# Patient Record
Sex: Male | Born: 1940 | Race: White | Hispanic: No | Marital: Married | Smoking: Former smoker
Health system: Southern US, Community
[De-identification: ages and names within clinical notes are randomized; demographics above are authoritative.]

## PROBLEM LIST (undated history)

## (undated) DIAGNOSIS — I2510 Atherosclerotic heart disease of native coronary artery without angina pectoris: Secondary | ICD-10-CM

## (undated) DIAGNOSIS — E785 Hyperlipidemia, unspecified: Secondary | ICD-10-CM

## (undated) DIAGNOSIS — R7881 Bacteremia: Secondary | ICD-10-CM

## (undated) DIAGNOSIS — I82409 Acute embolism and thrombosis of unspecified deep veins of unspecified lower extremity: Secondary | ICD-10-CM

## (undated) DIAGNOSIS — A0472 Enterocolitis due to Clostridium difficile, not specified as recurrent: Secondary | ICD-10-CM

## (undated) DIAGNOSIS — J690 Pneumonitis due to inhalation of food and vomit: Secondary | ICD-10-CM

## (undated) DIAGNOSIS — K7460 Unspecified cirrhosis of liver: Secondary | ICD-10-CM

## (undated) DIAGNOSIS — R6521 Severe sepsis with septic shock: Secondary | ICD-10-CM

## (undated) DIAGNOSIS — K7581 Nonalcoholic steatohepatitis (NASH): Secondary | ICD-10-CM

## (undated) DIAGNOSIS — K7469 Other cirrhosis of liver: Secondary | ICD-10-CM

## (undated) DIAGNOSIS — D649 Anemia, unspecified: Secondary | ICD-10-CM

## (undated) DIAGNOSIS — K7290 Hepatic failure, unspecified without coma: Secondary | ICD-10-CM

## (undated) DIAGNOSIS — I10 Essential (primary) hypertension: Secondary | ICD-10-CM

## (undated) DIAGNOSIS — D696 Thrombocytopenia, unspecified: Secondary | ICD-10-CM

## (undated) DIAGNOSIS — I8500 Esophageal varices without bleeding: Secondary | ICD-10-CM

## (undated) DIAGNOSIS — K922 Gastrointestinal hemorrhage, unspecified: Secondary | ICD-10-CM

## (undated) DIAGNOSIS — G4730 Sleep apnea, unspecified: Secondary | ICD-10-CM

## (undated) DIAGNOSIS — N289 Disorder of kidney and ureter, unspecified: Secondary | ICD-10-CM

## (undated) DIAGNOSIS — A419 Sepsis, unspecified organism: Secondary | ICD-10-CM

## (undated) HISTORY — DX: Acute embolism and thrombosis of unspecified deep veins of unspecified lower extremity: I82.409

## (undated) HISTORY — DX: Essential (primary) hypertension: I10

## (undated) HISTORY — PX: BACK SURGERY: SHX140

## (undated) HISTORY — DX: Thrombocytopenia, unspecified: D69.6

## (undated) HISTORY — DX: Bacteremia: R78.81

## (undated) HISTORY — PX: CARPAL TUNNEL RELEASE: SHX101

## (undated) HISTORY — DX: Sleep apnea, unspecified: G47.30

## (undated) HISTORY — DX: Pneumonitis due to inhalation of food and vomit: J69.0

## (undated) HISTORY — DX: Hepatic failure, unspecified without coma: K72.90

## (undated) HISTORY — DX: Nonalcoholic steatohepatitis (NASH): K75.81

## (undated) HISTORY — DX: Gastrointestinal hemorrhage, unspecified: K92.2

## (undated) HISTORY — DX: Atherosclerotic heart disease of native coronary artery without angina pectoris: I25.10

## (undated) HISTORY — DX: Hyperlipidemia, unspecified: E78.5

## (undated) HISTORY — DX: Enterocolitis due to Clostridium difficile, not specified as recurrent: A04.72

## (undated) HISTORY — DX: Other cirrhosis of liver: K74.69

## (undated) HISTORY — DX: Sepsis, unspecified organism: R65.21

## (undated) HISTORY — DX: Esophageal varices without bleeding: I85.00

## (undated) HISTORY — DX: Unspecified cirrhosis of liver: K74.60

## (undated) HISTORY — PX: AORTIC VALVE REPLACEMENT: SHX41

## (undated) HISTORY — PX: OTHER SURGICAL HISTORY: SHX169

## (undated) HISTORY — DX: Disorder of kidney and ureter, unspecified: N28.9

## (undated) HISTORY — DX: Anemia, unspecified: D64.9

## (undated) HISTORY — DX: Sepsis, unspecified organism: A41.9

---

## 2005-06-11 ENCOUNTER — Ambulatory Visit: Payer: Self-pay | Admitting: Internal Medicine

## 2005-07-16 ENCOUNTER — Ambulatory Visit: Payer: Self-pay | Admitting: Internal Medicine

## 2008-07-13 ENCOUNTER — Ambulatory Visit: Payer: Self-pay | Admitting: Gastroenterology

## 2008-07-16 ENCOUNTER — Ambulatory Visit: Payer: Self-pay | Admitting: Gastroenterology

## 2008-08-31 ENCOUNTER — Ambulatory Visit: Payer: Self-pay | Admitting: Gastroenterology

## 2009-06-21 ENCOUNTER — Ambulatory Visit: Payer: Self-pay | Admitting: Gastroenterology

## 2009-08-26 ENCOUNTER — Ambulatory Visit: Payer: Self-pay | Admitting: Rheumatology

## 2009-10-07 ENCOUNTER — Ambulatory Visit: Payer: Self-pay | Admitting: Unknown Physician Specialty

## 2009-10-13 ENCOUNTER — Ambulatory Visit: Payer: Self-pay | Admitting: Unknown Physician Specialty

## 2010-03-10 ENCOUNTER — Ambulatory Visit: Payer: Self-pay | Admitting: Ophthalmology

## 2010-03-20 ENCOUNTER — Ambulatory Visit: Payer: Self-pay | Admitting: Ophthalmology

## 2010-04-19 ENCOUNTER — Ambulatory Visit: Payer: Self-pay | Admitting: Ophthalmology

## 2010-05-08 ENCOUNTER — Ambulatory Visit: Payer: Self-pay | Admitting: Ophthalmology

## 2010-10-06 ENCOUNTER — Ambulatory Visit: Payer: Self-pay

## 2010-10-25 ENCOUNTER — Ambulatory Visit: Payer: Self-pay | Admitting: Pain Medicine

## 2010-11-02 ENCOUNTER — Ambulatory Visit: Payer: Self-pay | Admitting: Pain Medicine

## 2010-11-13 ENCOUNTER — Ambulatory Visit: Payer: Self-pay | Admitting: Pain Medicine

## 2010-12-05 ENCOUNTER — Ambulatory Visit: Payer: Self-pay | Admitting: Pain Medicine

## 2010-12-21 ENCOUNTER — Ambulatory Visit: Payer: Self-pay | Admitting: Pain Medicine

## 2011-01-02 ENCOUNTER — Ambulatory Visit: Payer: Self-pay

## 2011-01-04 ENCOUNTER — Ambulatory Visit: Payer: Self-pay

## 2011-02-08 ENCOUNTER — Ambulatory Visit: Payer: Self-pay | Admitting: Pain Medicine

## 2011-02-26 ENCOUNTER — Ambulatory Visit: Payer: Self-pay | Admitting: Internal Medicine

## 2012-03-14 ENCOUNTER — Ambulatory Visit: Payer: Self-pay | Admitting: Gastroenterology

## 2012-03-14 LAB — PLATELET COUNT: Platelet: 69 10*3/uL — ABNORMAL LOW (ref 150–440)

## 2012-03-14 LAB — PROTIME-INR: Prothrombin Time: 15.1 secs — ABNORMAL HIGH (ref 11.5–14.7)

## 2012-07-08 ENCOUNTER — Ambulatory Visit: Payer: Self-pay | Admitting: Gastroenterology

## 2012-07-08 LAB — CBC WITH DIFFERENTIAL/PLATELET
Basophil #: 0 10*3/uL (ref 0.0–0.1)
Basophil %: 0.5 %
Eosinophil #: 0.2 10*3/uL (ref 0.0–0.7)
HGB: 10.9 g/dL — ABNORMAL LOW (ref 13.0–18.0)
Lymphocyte #: 0.7 10*3/uL — ABNORMAL LOW (ref 1.0–3.6)
Lymphocyte %: 10.6 %
MCH: 41.9 pg — ABNORMAL HIGH (ref 26.0–34.0)
MCHC: 35.9 g/dL (ref 32.0–36.0)
MCV: 117 fL — ABNORMAL HIGH (ref 80–100)
Monocyte #: 1 x10 3/mm (ref 0.2–1.0)
Neutrophil #: 4.5 10*3/uL (ref 1.4–6.5)
Neutrophil %: 70.3 %
RBC: 2.59 10*6/uL — ABNORMAL LOW (ref 4.40–5.90)
RDW: 16.9 % — ABNORMAL HIGH (ref 11.5–14.5)

## 2012-07-08 LAB — PROTIME-INR: INR: 1.2

## 2012-08-26 ENCOUNTER — Ambulatory Visit: Payer: Self-pay | Admitting: Gastroenterology

## 2012-12-10 ENCOUNTER — Emergency Department: Payer: Self-pay | Admitting: Emergency Medicine

## 2013-01-12 ENCOUNTER — Ambulatory Visit: Payer: Self-pay | Admitting: Rheumatology

## 2013-01-29 ENCOUNTER — Inpatient Hospital Stay: Payer: Self-pay | Admitting: Internal Medicine

## 2013-01-29 LAB — CBC WITH DIFFERENTIAL/PLATELET
Basophil #: 0.1 10*3/uL (ref 0.0–0.1)
Basophil #: 0 10*3/uL (ref 0.0–0.1)
Basophil %: 1.1 %
Basophil %: 0.3 %
Basophil %: 1.1 %
Eosinophil #: 0.3 10*3/uL (ref 0.0–0.7)
Eosinophil %: 5.2 %
Eosinophil %: 0.3 %
Eosinophil %: 0.2 %
HCT: 21.6 % — ABNORMAL LOW (ref 40.0–52.0)
HCT: 23.9 % — ABNORMAL LOW (ref 40.0–52.0)
HGB: 7.5 g/dL — ABNORMAL LOW (ref 13.0–18.0)
HGB: 8.1 g/dL — ABNORMAL LOW (ref 13.0–18.0)
Lymphocyte #: 1 10*3/uL (ref 1.0–3.6)
Lymphocyte %: 20.7 %
Lymphocyte %: 5.7 %
Lymphocyte %: 10.4 %
MCH: 33.1 pg (ref 26.0–34.0)
MCHC: 34 g/dL (ref 32.0–36.0)
MCHC: 33.4 g/dL (ref 32.0–36.0)
MCV: 104 fL — ABNORMAL HIGH (ref 80–100)
MCV: 97 fL (ref 80–100)
MCV: 98 fL (ref 80–100)
Monocyte #: 0.5 x10 3/mm (ref 0.2–1.0)
Monocyte #: 0.7 x10 3/mm (ref 0.2–1.0)
Monocyte %: 7.3 %
Monocyte %: 5.6 %
Neutrophil #: 8 10*3/uL — ABNORMAL HIGH (ref 1.4–6.5)
Neutrophil #: 12.6 10*3/uL — ABNORMAL HIGH (ref 1.4–6.5)
Neutrophil %: 83.9 %
Platelet: 71 10*3/uL — ABNORMAL LOW (ref 150–440)
RBC: 2.09 10*6/uL — ABNORMAL LOW (ref 4.40–5.90)
RBC: 2.46 10*6/uL — ABNORMAL LOW (ref 4.40–5.90)
WBC: 5 10*3/uL (ref 3.8–10.6)
WBC: 15.1 10*3/uL — ABNORMAL HIGH (ref 3.8–10.6)

## 2013-01-29 LAB — COMPREHENSIVE METABOLIC PANEL
Alkaline Phosphatase: 115 U/L (ref 50–136)
Anion Gap: 5 — ABNORMAL LOW (ref 7–16)
BUN: 39 mg/dL — ABNORMAL HIGH (ref 7–18)
Bilirubin,Total: 0.6 mg/dL (ref 0.2–1.0)
Chloride: 110 mmol/L — ABNORMAL HIGH (ref 98–107)
Co2: 28 mmol/L (ref 21–32)
EGFR (African American): >60
EGFR (Non-African Amer.): >60
Osmolality: 295 (ref 275–301)
Potassium: 4.2 mmol/L (ref 3.5–5.1)
SGOT(AST): 48 U/L — ABNORMAL HIGH (ref 15–37)
Total Protein: 5.7 g/dL — ABNORMAL LOW (ref 6.4–8.2)

## 2013-01-29 LAB — PROTIME-INR: INR: 1.3

## 2013-01-30 LAB — CBC WITH DIFFERENTIAL/PLATELET
Basophil #: 0 10*3/uL (ref 0.0–0.1)
Basophil %: 0.2 %
Basophil %: 0.3 %
Eosinophil #: 0.1 10*3/uL (ref 0.0–0.7)
Eosinophil #: 0.1 10*3/uL (ref 0.0–0.7)
Eosinophil %: 0.8 %
Eosinophil %: 1.3 %
HCT: 21.3 % — ABNORMAL LOW (ref 40.0–52.0)
HGB: 7.6 g/dL — ABNORMAL LOW (ref 13.0–18.0)
HGB: 7.2 g/dL — ABNORMAL LOW (ref 13.0–18.0)
Lymphocyte #: 1.1 10*3/uL (ref 1.0–3.6)
Lymphocyte #: 0.7 10*3/uL — ABNORMAL LOW (ref 1.0–3.6)
Lymphocyte %: 13.6 %
Lymphocyte %: 9.9 %
MCH: 33.7 pg (ref 26.0–34.0)
MCH: 33.2 pg (ref 26.0–34.0)
MCHC: 33.7 g/dL (ref 32.0–36.0)
MCV: 98 fL (ref 80–100)
MCV: 98 fL (ref 80–100)
Monocyte #: 0.4 x10 3/mm (ref 0.2–1.0)
Monocyte #: 0.3 x10 3/mm (ref 0.2–1.0)
Monocyte %: 4.9 %
Neutrophil #: 6.8 10*3/uL — ABNORMAL HIGH (ref 1.4–6.5)
Neutrophil #: 5.9 10*3/uL (ref 1.4–6.5)
Neutrophil %: 80.2 %
Neutrophil %: 83.6 %
Platelet: 44 10*3/uL — ABNORMAL LOW (ref 150–440)
Platelet: 42 10*3/uL — ABNORMAL LOW (ref 150–440)
RBC: 2.26 10*6/uL — ABNORMAL LOW (ref 4.40–5.90)
RBC: 2.17 10*6/uL — ABNORMAL LOW (ref 4.40–5.90)
RDW: 18.7 % — ABNORMAL HIGH (ref 11.5–14.5)
RDW: 18.8 % — ABNORMAL HIGH (ref 11.5–14.5)
WBC: 7.1 10*3/uL (ref 3.8–10.6)

## 2013-01-30 LAB — URINALYSIS, COMPLETE
Bacteria: NONE SEEN
Bilirubin,UR: NEGATIVE
Blood: NEGATIVE
Glucose,UR: NEGATIVE mg/dL (ref 0–75)
Ketone: NEGATIVE
Leukocyte Esterase: NEGATIVE
Nitrite: NEGATIVE
Ph: 5 (ref 4.5–8.0)
Protein: NEGATIVE
RBC,UR: 1 /HPF (ref 0–5)
Specific Gravity: 1.019 (ref 1.003–1.030)
Squamous Epithelial: NONE SEEN
WBC UR: <1 /HPF (ref 0–5)

## 2013-01-31 LAB — BASIC METABOLIC PANEL
BUN: 28 mg/dL — ABNORMAL HIGH (ref 7–18)
Calcium, Total: 7.2 mg/dL — ABNORMAL LOW (ref 8.5–10.1)
Co2: 23 mmol/L (ref 21–32)
Creatinine: 0.97 mg/dL (ref 0.60–1.30)
EGFR (African American): >60
EGFR (Non-African Amer.): >60
Glucose: 101 mg/dL — ABNORMAL HIGH (ref 65–99)
Osmolality: 296 (ref 275–301)
Potassium: 3.2 mmol/L — ABNORMAL LOW (ref 3.5–5.1)
Sodium: 146 mmol/L — ABNORMAL HIGH (ref 136–145)

## 2013-01-31 LAB — CBC WITH DIFFERENTIAL/PLATELET
Basophil %: 0.2 %
Eosinophil #: 0.2 10*3/uL (ref 0.0–0.7)
Eosinophil %: 3.2 %
HCT: 18.9 % — ABNORMAL LOW (ref 40.0–52.0)
Lymphocyte %: 12.7 %
MCHC: 33.7 g/dL (ref 32.0–36.0)
MCV: 98 fL (ref 80–100)
Monocyte #: 0.3 x10 3/mm (ref 0.2–1.0)
Monocyte %: 6.4 %
Neutrophil #: 4.2 10*3/uL (ref 1.4–6.5)
Neutrophil %: 77.5 %
RBC: 1.92 10*6/uL — ABNORMAL LOW (ref 4.40–5.90)
RDW: 18.8 % — ABNORMAL HIGH (ref 11.5–14.5)

## 2013-02-01 LAB — BASIC METABOLIC PANEL
Calcium, Total: 7.6 mg/dL — ABNORMAL LOW (ref 8.5–10.1)
Co2: 22 mmol/L (ref 21–32)
EGFR (African American): >60
EGFR (Non-African Amer.): >60
Osmolality: 286 (ref 275–301)
Potassium: 3.9 mmol/L (ref 3.5–5.1)
Sodium: 142 mmol/L (ref 136–145)

## 2013-02-01 LAB — CBC WITH DIFFERENTIAL/PLATELET
Basophil #: 0 10*3/uL (ref 0.0–0.1)
Basophil %: 0.3 %
Eosinophil #: 0.3 10*3/uL (ref 0.0–0.7)
Eosinophil %: 4.6 %
HCT: 26.7 % — ABNORMAL LOW (ref 40.0–52.0)
HGB: 9.1 g/dL — ABNORMAL LOW (ref 13.0–18.0)
MCHC: 34.1 g/dL (ref 32.0–36.0)
MCV: 98 fL (ref 80–100)
Monocyte #: 0.5 x10 3/mm (ref 0.2–1.0)
Monocyte %: 7.5 %
Neutrophil #: 4.9 10*3/uL (ref 1.4–6.5)
Platelet: 49 10*3/uL — ABNORMAL LOW (ref 150–440)
RBC: 2.74 10*6/uL — ABNORMAL LOW (ref 4.40–5.90)
RDW: 19.1 % — ABNORMAL HIGH (ref 11.5–14.5)
WBC: 6.4 10*3/uL (ref 3.8–10.6)

## 2013-02-02 LAB — CBC WITH DIFFERENTIAL/PLATELET
Basophil #: 0 10*3/uL (ref 0.0–0.1)
Basophil %: 0.5 %
Eosinophil #: 0.4 10*3/uL (ref 0.0–0.7)
Eosinophil %: 6.4 %
HCT: 27.6 % — ABNORMAL LOW (ref 40.0–52.0)
MCH: 33.7 pg (ref 26.0–34.0)
Monocyte #: 0.8 x10 3/mm (ref 0.2–1.0)
Neutrophil %: 68.2 %
RDW: 18.4 % — ABNORMAL HIGH (ref 11.5–14.5)

## 2013-02-02 LAB — BASIC METABOLIC PANEL
Calcium, Total: 7.6 mg/dL — ABNORMAL LOW (ref 8.5–10.1)
Chloride: 108 mmol/L — ABNORMAL HIGH (ref 98–107)
Co2: 26 mmol/L (ref 21–32)
Creatinine: 0.76 mg/dL (ref 0.60–1.30)
Glucose: 82 mg/dL (ref 65–99)
Osmolality: 279 (ref 275–301)
Potassium: 3.6 mmol/L (ref 3.5–5.1)
Sodium: 139 mmol/L (ref 136–145)

## 2014-04-26 ENCOUNTER — Ambulatory Visit: Payer: Self-pay | Admitting: Oncology

## 2014-05-14 ENCOUNTER — Inpatient Hospital Stay: Payer: Self-pay | Admitting: Internal Medicine

## 2014-05-14 LAB — URINALYSIS, COMPLETE
BILIRUBIN, UR: NEGATIVE
Bacteria: NONE SEEN
GLUCOSE, UR: NEGATIVE mg/dL (ref 0–75)
Hyaline Cast: 21
KETONE: NEGATIVE
LEUKOCYTE ESTERASE: NEGATIVE
Nitrite: NEGATIVE
PH: 5 (ref 4.5–8.0)
PROTEIN: NEGATIVE
RBC,UR: 2 /HPF (ref 0–5)
SPECIFIC GRAVITY: 1.013 (ref 1.003–1.030)
Squamous Epithelial: 1
WBC UR: 2 /HPF (ref 0–5)

## 2014-05-14 LAB — CBC WITH DIFFERENTIAL/PLATELET
BASOS PCT: 0.5 %
Basophil #: 0 10*3/uL (ref 0.0–0.1)
Eosinophil #: 0 10*3/uL (ref 0.0–0.7)
Eosinophil %: 0 %
HCT: 31.7 % — ABNORMAL LOW (ref 40.0–52.0)
HGB: 10.9 g/dL — ABNORMAL LOW (ref 13.0–18.0)
LYMPHS PCT: 0.9 %
Lymphocyte #: 0.1 10*3/uL — ABNORMAL LOW (ref 1.0–3.6)
MCH: 38.3 pg — ABNORMAL HIGH (ref 26.0–34.0)
MCHC: 34.4 g/dL (ref 32.0–36.0)
MCV: 111 fL — ABNORMAL HIGH (ref 80–100)
Monocyte #: 0.3 x10 3/mm (ref 0.2–1.0)
Monocyte %: 3.2 %
NEUTROS PCT: 95.4 %
Neutrophil #: 8.8 10*3/uL — ABNORMAL HIGH (ref 1.4–6.5)
Platelet: 51 10*3/uL — ABNORMAL LOW (ref 150–440)
RBC: 2.84 10*6/uL — ABNORMAL LOW (ref 4.40–5.90)
RDW: 15 % — ABNORMAL HIGH (ref 11.5–14.5)
WBC: 9.2 10*3/uL (ref 3.8–10.6)

## 2014-05-14 LAB — COMPREHENSIVE METABOLIC PANEL
ANION GAP: 9 (ref 7–16)
Albumin: 2.3 g/dL — ABNORMAL LOW (ref 3.4–5.0)
Alkaline Phosphatase: 101 U/L
BILIRUBIN TOTAL: 1.4 mg/dL — AB (ref 0.2–1.0)
BUN: 23 mg/dL — AB (ref 7–18)
CHLORIDE: 99 mmol/L (ref 98–107)
CO2: 27 mmol/L (ref 21–32)
Calcium, Total: 8.2 mg/dL — ABNORMAL LOW (ref 8.5–10.1)
Creatinine: 1.8 mg/dL — ABNORMAL HIGH (ref 0.60–1.30)
EGFR (African American): 43 — ABNORMAL LOW
GFR CALC NON AF AMER: 37 — AB
GLUCOSE: 97 mg/dL (ref 65–99)
Osmolality: 274 (ref 275–301)
Potassium: 3.4 mmol/L — ABNORMAL LOW (ref 3.5–5.1)
SGOT(AST): 53 U/L — ABNORMAL HIGH (ref 15–37)
SGPT (ALT): 24 U/L (ref 12–78)
Sodium: 135 mmol/L — ABNORMAL LOW (ref 136–145)
Total Protein: 6.6 g/dL (ref 6.4–8.2)

## 2014-05-14 LAB — PHOSPHORUS: PHOSPHORUS: 3.7 mg/dL (ref 2.5–4.9)

## 2014-05-14 LAB — TROPONIN I: Troponin-I: 0.04 ng/mL

## 2014-05-14 LAB — PROTIME-INR
INR: 1.3
Prothrombin Time: 15.8 secs — ABNORMAL HIGH (ref 11.5–14.7)

## 2014-05-14 LAB — BASIC METABOLIC PANEL
BUN: 23 mg/dL — AB (ref 4–21)
Creatinine: 1.8 mg/dL — AB (ref 0.6–1.3)
Glucose: 97 mg/dL
Potassium: 3.4 mmol/L (ref 3.4–5.3)
Sodium: 135 mmol/L — AB (ref 137–147)

## 2014-05-14 LAB — HEPATIC FUNCTION PANEL
ALT: 24 U/L (ref 10–40)
AST: 53 U/L — AB (ref 14–40)
Alkaline Phosphatase: 101 U/L (ref 25–125)
Bilirubin, Total: 1.4 mg/dL

## 2014-05-14 LAB — MAGNESIUM: Magnesium: 1.4 mg/dL — ABNORMAL LOW

## 2014-05-15 LAB — BASIC METABOLIC PANEL
Anion Gap: 7 (ref 7–16)
BUN: 17 mg/dL (ref 7–18)
CHLORIDE: 107 mmol/L (ref 98–107)
CO2: 25 mmol/L (ref 21–32)
Calcium, Total: 7.3 mg/dL — ABNORMAL LOW (ref 8.5–10.1)
Creatinine: 0.99 mg/dL (ref 0.60–1.30)
EGFR (African American): >60
EGFR (Non-African Amer.): >60
Glucose: 95 mg/dL (ref 65–99)
Osmolality: 279 (ref 275–301)
Potassium: 2.9 mmol/L — ABNORMAL LOW (ref 3.5–5.1)
Sodium: 139 mmol/L (ref 136–145)

## 2014-05-15 LAB — CBC WITH DIFFERENTIAL/PLATELET
BASOS ABS: 0 10*3/uL (ref 0.0–0.1)
Basophil %: 0.2 %
Eosinophil #: 0 10*3/uL (ref 0.0–0.7)
Eosinophil %: 0.3 %
HCT: 25.8 % — ABNORMAL LOW (ref 40.0–52.0)
HGB: 8.9 g/dL — ABNORMAL LOW (ref 13.0–18.0)
LYMPHS PCT: 1.4 %
Lymphocyte #: 0.1 10*3/uL — ABNORMAL LOW (ref 1.0–3.6)
MCH: 38 pg — ABNORMAL HIGH (ref 26.0–34.0)
MCHC: 34.6 g/dL (ref 32.0–36.0)
MCV: 110 fL — AB (ref 80–100)
MONOS PCT: 6.8 %
Monocyte #: 0.5 x10 3/mm (ref 0.2–1.0)
NEUTROS ABS: 7.2 10*3/uL — AB (ref 1.4–6.5)
NEUTROS PCT: 91.3 %
PLATELETS: 35 10*3/uL — AB (ref 150–440)
RBC: 2.35 10*6/uL — ABNORMAL LOW (ref 4.40–5.90)
RDW: 15.2 % — ABNORMAL HIGH (ref 11.5–14.5)
WBC: 7.9 10*3/uL (ref 3.8–10.6)

## 2014-05-15 LAB — AMMONIA
Ammonia, Plasma: 34 mcmol/L — ABNORMAL HIGH (ref 11–32)
Ammonia: 34

## 2014-05-16 LAB — CBC WITH DIFFERENTIAL/PLATELET
BASOS PCT: 0.2 %
Basophil #: 0 10*3/uL (ref 0.0–0.1)
EOS ABS: 0 10*3/uL (ref 0.0–0.7)
EOS PCT: 0.6 %
HCT: 23.9 % — ABNORMAL LOW (ref 40.0–52.0)
HGB: 8.2 g/dL — ABNORMAL LOW (ref 13.0–18.0)
LYMPHS PCT: 4.3 %
Lymphocyte #: 0.2 10*3/uL — ABNORMAL LOW (ref 1.0–3.6)
MCH: 37.9 pg — AB (ref 26.0–34.0)
MCHC: 34.5 g/dL (ref 32.0–36.0)
MCV: 110 fL — ABNORMAL HIGH (ref 80–100)
Monocyte #: 0.3 x10 3/mm (ref 0.2–1.0)
Monocyte %: 7.6 %
Neutrophil #: 3.4 10*3/uL (ref 1.4–6.5)
Neutrophil %: 87.3 %
Platelet: 20 10*3/uL — CL (ref 150–440)
RBC: 2.18 10*6/uL — ABNORMAL LOW (ref 4.40–5.90)
RDW: 14.9 % — AB (ref 11.5–14.5)
WBC: 3.9 10*3/uL (ref 3.8–10.6)

## 2014-05-16 LAB — BASIC METABOLIC PANEL
Anion Gap: 8 (ref 7–16)
BUN: 12 mg/dL (ref 7–18)
CREATININE: 0.78 mg/dL (ref 0.60–1.30)
Calcium, Total: 7.8 mg/dL — ABNORMAL LOW (ref 8.5–10.1)
Chloride: 107 mmol/L (ref 98–107)
Co2: 24 mmol/L (ref 21–32)
EGFR (African American): >60
EGFR (Non-African Amer.): >60
Glucose: 98 mg/dL (ref 65–99)
Osmolality: 277 (ref 275–301)
Potassium: 3.2 mmol/L — ABNORMAL LOW (ref 3.5–5.1)
Sodium: 139 mmol/L (ref 136–145)

## 2014-05-16 LAB — URINE CULTURE

## 2014-05-16 LAB — VANCOMYCIN, TROUGH: VANCOMYCIN, TROUGH: 12 ug/mL (ref 10–20)

## 2014-05-17 LAB — CBC WITH DIFFERENTIAL/PLATELET
BASOS ABS: 0.1 10*3/uL (ref 0.0–0.1)
BASOS PCT: 1.6 %
EOS PCT: 1.2 %
Eosinophil #: 0.1 10*3/uL (ref 0.0–0.7)
HCT: 25.6 % — AB (ref 40.0–52.0)
HGB: 8.8 g/dL — ABNORMAL LOW (ref 13.0–18.0)
Lymphocyte #: 0.2 10*3/uL — ABNORMAL LOW (ref 1.0–3.6)
Lymphocyte %: 3.3 %
MCH: 37.6 pg — ABNORMAL HIGH (ref 26.0–34.0)
MCHC: 34.3 g/dL (ref 32.0–36.0)
MCV: 110 fL — ABNORMAL HIGH (ref 80–100)
Monocyte #: 0.4 x10 3/mm (ref 0.2–1.0)
Monocyte %: 7.7 %
NEUTROS PCT: 86.2 %
Neutrophil #: 4.5 10*3/uL (ref 1.4–6.5)
Platelet: 24 10*3/uL — CL (ref 150–440)
RBC: 2.33 10*6/uL — AB (ref 4.40–5.90)
RDW: 15.1 % — AB (ref 11.5–14.5)
WBC: 5.3 10*3/uL (ref 3.8–10.6)

## 2014-05-17 LAB — BASIC METABOLIC PANEL
ANION GAP: 8 (ref 7–16)
BUN: 10 mg/dL (ref 7–18)
CREATININE: 0.69 mg/dL (ref 0.60–1.30)
Calcium, Total: 8.1 mg/dL — ABNORMAL LOW (ref 8.5–10.1)
Chloride: 112 mmol/L — ABNORMAL HIGH (ref 98–107)
Co2: 23 mmol/L (ref 21–32)
GLUCOSE: 87 mg/dL (ref 65–99)
Osmolality: 283 (ref 275–301)
POTASSIUM: 2.9 mmol/L — AB (ref 3.5–5.1)
Sodium: 143 mmol/L (ref 136–145)

## 2014-05-18 LAB — CBC WITH DIFFERENTIAL/PLATELET
BASOS ABS: 0 10*3/uL (ref 0.0–0.1)
BASOS PCT: 0.2 %
EOS ABS: 0.1 10*3/uL (ref 0.0–0.7)
EOS PCT: 1 %
HCT: 27.2 % — ABNORMAL LOW (ref 40.0–52.0)
HGB: 9.3 g/dL — AB (ref 13.0–18.0)
Lymphocyte #: 0.5 10*3/uL — ABNORMAL LOW (ref 1.0–3.6)
Lymphocyte %: 6.2 %
MCH: 37.3 pg — AB (ref 26.0–34.0)
MCHC: 34.2 g/dL (ref 32.0–36.0)
MCV: 109 fL — AB (ref 80–100)
Monocyte #: 0.9 x10 3/mm (ref 0.2–1.0)
Monocyte %: 10.4 %
NEUTROS ABS: 6.8 10*3/uL — AB (ref 1.4–6.5)
Neutrophil %: 82.2 %
Platelet: 30 10*3/uL — CL (ref 150–440)
RBC: 2.49 10*6/uL — ABNORMAL LOW (ref 4.40–5.90)
RDW: 15.7 % — ABNORMAL HIGH (ref 11.5–14.5)
WBC: 8.2 10*3/uL (ref 3.8–10.6)

## 2014-05-18 LAB — MAGNESIUM
MAGNESIUM: 1.8 mg/dL (ref 1.6–2.4)
Magnesium: 1.6 mg/dL — ABNORMAL LOW

## 2014-05-18 LAB — BASIC METABOLIC PANEL
Anion Gap: 8 (ref 7–16)
BUN: 9 mg/dL (ref 7–18)
BUN: 9 mg/dL (ref 4–21)
CALCIUM: 8.4 mg/dL — AB (ref 8.5–10.1)
CHLORIDE: 113 mmol/L — AB (ref 98–107)
CREATININE: 0.71 mg/dL (ref 0.60–1.30)
Co2: 22 mmol/L (ref 21–32)
Creatinine: 0.7 mg/dL (ref 0.6–1.3)
EGFR (African American): >60
EGFR (Non-African Amer.): >60
GLUCOSE: 86 mg/dL (ref 65–99)
Glucose: 86 mg/dL
OSMOLALITY: 283 (ref 275–301)
Potassium: 3.1 mmol/L — AB (ref 3.4–5.3)
Potassium: 3.1 mmol/L — ABNORMAL LOW (ref 3.5–5.1)
SODIUM: 143 mmol/L (ref 136–145)
SODIUM: 143 mmol/L (ref 137–147)

## 2014-05-18 LAB — CLOSTRIDIUM DIFFICILE(ARMC)

## 2014-05-18 LAB — VANCOMYCIN, TROUGH: VANCOMYCIN, TROUGH: 14 ug/mL (ref 10–20)

## 2014-05-19 LAB — CBC WITH DIFFERENTIAL/PLATELET
BASOS ABS: 0 10*3/uL (ref 0.0–0.1)
BASOS PCT: 0.6 %
Eosinophil #: 0.2 10*3/uL (ref 0.0–0.7)
Eosinophil %: 2.7 %
HCT: 26.3 % — AB (ref 40.0–52.0)
HGB: 9 g/dL — AB (ref 13.0–18.0)
Lymphocyte #: 0.7 10*3/uL — ABNORMAL LOW (ref 1.0–3.6)
Lymphocyte %: 8 %
MCH: 37.6 pg — AB (ref 26.0–34.0)
MCHC: 34.3 g/dL (ref 32.0–36.0)
MCV: 109 fL — ABNORMAL HIGH (ref 80–100)
MONO ABS: 1 x10 3/mm (ref 0.2–1.0)
MONOS PCT: 11.5 %
NEUTROS PCT: 77.2 %
Neutrophil #: 6.6 10*3/uL — ABNORMAL HIGH (ref 1.4–6.5)
Platelet: 40 10*3/uL — ABNORMAL LOW (ref 150–440)
RBC: 2.4 10*6/uL — ABNORMAL LOW (ref 4.40–5.90)
RDW: 15.2 % — ABNORMAL HIGH (ref 11.5–14.5)
WBC: 8.5 10*3/uL (ref 3.8–10.6)

## 2014-05-19 LAB — BASIC METABOLIC PANEL
Anion Gap: 9 (ref 7–16)
BUN: 6 mg/dL — AB (ref 7–18)
Calcium, Total: 8.5 mg/dL (ref 8.5–10.1)
Chloride: 111 mmol/L — ABNORMAL HIGH (ref 98–107)
Co2: 22 mmol/L (ref 21–32)
Creatinine: 0.5 mg/dL — ABNORMAL LOW (ref 0.60–1.30)
EGFR (African American): >60
EGFR (Non-African Amer.): >60
Glucose: 87 mg/dL (ref 65–99)
OSMOLALITY: 280 (ref 275–301)
Potassium: 3.2 mmol/L — ABNORMAL LOW (ref 3.5–5.1)
Sodium: 142 mmol/L (ref 136–145)

## 2014-05-19 LAB — MAGNESIUM: Magnesium: 1.7 mg/dL — ABNORMAL LOW

## 2014-05-19 LAB — CULTURE, BLOOD (SINGLE)

## 2014-05-20 LAB — VANCOMYCIN, TROUGH: Vancomycin, Trough: 19 ug/mL (ref 10–20)

## 2014-05-20 LAB — CBC WITH DIFFERENTIAL/PLATELET
BASOS ABS: 0 10*3/uL (ref 0.0–0.1)
Basophil %: 0.4 %
Eosinophil #: 0.2 10*3/uL (ref 0.0–0.7)
Eosinophil %: 2.5 %
HCT: 26 % — AB (ref 40.0–52.0)
HGB: 8.8 g/dL — AB (ref 13.0–18.0)
LYMPHS PCT: 8.1 %
Lymphocyte #: 0.8 10*3/uL — ABNORMAL LOW (ref 1.0–3.6)
MCH: 36.9 pg — ABNORMAL HIGH (ref 26.0–34.0)
MCHC: 33.8 g/dL (ref 32.0–36.0)
MCV: 109 fL — AB (ref 80–100)
MONO ABS: 1.1 x10 3/mm — AB (ref 0.2–1.0)
MONOS PCT: 11.3 %
NEUTROS PCT: 77.7 %
Neutrophil #: 7.5 10*3/uL — ABNORMAL HIGH (ref 1.4–6.5)
Platelet: 50 10*3/uL — ABNORMAL LOW (ref 150–440)
RBC: 2.39 10*6/uL — AB (ref 4.40–5.90)
RDW: 15.2 % — AB (ref 11.5–14.5)
WBC: 9.7 10*3/uL (ref 3.8–10.6)

## 2014-05-20 LAB — MAGNESIUM: MAGNESIUM: 1.8 mg/dL

## 2014-05-20 LAB — BASIC METABOLIC PANEL
ANION GAP: 7 (ref 7–16)
BUN: 7 mg/dL (ref 7–18)
BUN: 7 mg/dL (ref 4–21)
CREATININE: 0.59 mg/dL — AB (ref 0.60–1.30)
CREATININE: 0.6 mg/dL (ref 0.6–1.3)
Calcium, Total: 8.3 mg/dL — ABNORMAL LOW (ref 8.5–10.1)
Chloride: 110 mmol/L — ABNORMAL HIGH (ref 98–107)
Co2: 22 mmol/L (ref 21–32)
EGFR (Non-African Amer.): >60
GLUCOSE: 88 mg/dL (ref 65–99)
Glucose: 88 mg/dL
Osmolality: 275 (ref 275–301)
POTASSIUM: 3.4 mmol/L (ref 3.4–5.3)
Potassium: 3.4 mmol/L — ABNORMAL LOW (ref 3.5–5.1)
SODIUM: 139 mmol/L (ref 136–145)
Sodium: 139 mmol/L (ref 137–147)

## 2014-05-20 LAB — CBC AND DIFFERENTIAL
HCT: 26 % — AB (ref 41–53)
Hemoglobin: 8.8 g/dL — AB (ref 13.5–17.5)
PLATELETS: 50 10*3/uL — AB (ref 150–399)
WBC: 9.7 10^3/mL

## 2014-05-21 ENCOUNTER — Non-Acute Institutional Stay (SKILLED_NURSING_FACILITY): Payer: Commercial Managed Care - HMO | Admitting: Adult Health

## 2014-05-21 ENCOUNTER — Encounter: Payer: Self-pay | Admitting: Adult Health

## 2014-05-21 DIAGNOSIS — A4902 Methicillin resistant Staphylococcus aureus infection, unspecified site: Secondary | ICD-10-CM

## 2014-05-21 DIAGNOSIS — T50904A Poisoning by unspecified drugs, medicaments and biological substances, undetermined, initial encounter: Secondary | ICD-10-CM

## 2014-05-21 DIAGNOSIS — K7290 Hepatic failure, unspecified without coma: Secondary | ICD-10-CM

## 2014-05-21 DIAGNOSIS — R197 Diarrhea, unspecified: Secondary | ICD-10-CM

## 2014-05-21 DIAGNOSIS — D696 Thrombocytopenia, unspecified: Secondary | ICD-10-CM

## 2014-05-21 DIAGNOSIS — I82409 Acute embolism and thrombosis of unspecified deep veins of unspecified lower extremity: Secondary | ICD-10-CM

## 2014-05-21 DIAGNOSIS — K521 Toxic gastroenteritis and colitis: Secondary | ICD-10-CM | POA: Insufficient documentation

## 2014-05-21 DIAGNOSIS — B9562 Methicillin resistant Staphylococcus aureus infection as the cause of diseases classified elsewhere: Secondary | ICD-10-CM | POA: Insufficient documentation

## 2014-05-21 DIAGNOSIS — K7689 Other specified diseases of liver: Secondary | ICD-10-CM

## 2014-05-21 DIAGNOSIS — Z954 Presence of other heart-valve replacement: Secondary | ICD-10-CM

## 2014-05-21 DIAGNOSIS — E876 Hypokalemia: Secondary | ICD-10-CM

## 2014-05-21 DIAGNOSIS — K7581 Nonalcoholic steatohepatitis (NASH): Secondary | ICD-10-CM

## 2014-05-21 DIAGNOSIS — Z952 Presence of prosthetic heart valve: Secondary | ICD-10-CM

## 2014-05-21 DIAGNOSIS — R7881 Bacteremia: Secondary | ICD-10-CM

## 2014-05-21 DIAGNOSIS — K7682 Hepatic encephalopathy: Secondary | ICD-10-CM

## 2014-05-21 DIAGNOSIS — A0472 Enterocolitis due to Clostridium difficile, not specified as recurrent: Secondary | ICD-10-CM | POA: Insufficient documentation

## 2014-05-21 HISTORY — DX: Hepatic failure, unspecified without coma: K72.90

## 2014-05-21 HISTORY — DX: Hepatic encephalopathy: K76.82

## 2014-05-21 HISTORY — DX: Bacteremia: R78.81

## 2014-05-21 HISTORY — DX: Enterocolitis due to Clostridium difficile, not specified as recurrent: A04.72

## 2014-05-21 NOTE — Progress Notes (Signed)
Patient ID: Delorise RoyalsFranklin W Begnaud, male   DOB: [birth year], 73 y.o.   MRN: [id number]     [facility]  Allergies  Allergen Reactions  . Zoloft [Sertraline Hcl]      Chief Complaint  Patient presents with  . Hospitalization Follow-up    HPI:  He has been hospitalized for septic shock with mrsa bacteremia. He has a history of liver cirrhosis with thrombocytopenia. He was not found to have endocarditis. He is being treated for c-diff diarrhea; but I cannot find these results. He is on 4 weeks IV abt per I/D. He will need a palliative care consult. He is having back pain; he has a history of lumbar back surgery in the past. I have spoken with therapy about pain management modalities. I have left a message for his family.    Past Medical History  Diagnosis Date  . Enteritis due to Clostridium difficile 05/21/2014  . Encephalopathy, hepatic 05/21/2014  . MRSA bacteremia 05/21/2014  . Hyperlipidemia   . Liver cirrhosis secondary to NASH (nonalcoholic steatohepatitis)   . Anemia   . Thrombocytopenia   . GI bleed   . Hypertension   . DVT (deep venous thrombosis)   . Septic shock   . Aspiration pneumonia   . Renal insufficiency   . CAD (coronary artery disease)   . Sleep apnea     Past Surgical History  Procedure Laterality Date  . Right knee replacement    . Carpal tunnel release    . Aortic valve replacement    . Back surgery     Filed Vitals:   05/21/14 2112  BP: 118/75  Pulse: 83     Patient's Medications  New Prescriptions   No medications on file  Previous Medications   LACTULOSE, ENCEPHALOPATHY, (CHRONULAC) 10 GM/15ML SOLN    Take 40 g by mouth 2 (two) times daily.   METRONIDAZOLE (FLAGYL) 500 MG TABLET    Take 500 mg by mouth 3 (three) times daily.   MULTIPLE VITAMINS-MINERALS (CENTRUM SILVER ADULT 50+ PO)    Take 1 tablet by mouth daily.   PANTOPRAZOLE (PROTONIX) 40 MG TABLET    Take 40 mg by mouth 2 (two) times daily.   POTASSIUM CHLORIDE (K-DUR,KLOR-CON) 10 MEQ  TABLET    Take 10 mEq by mouth daily.   TORSEMIDE (DEMADEX) 20 MG TABLET    Take 20 mg by mouth 2 (two) times daily.   VANCOMYCIN 1,000 MG IN SODIUM CHLORIDE 0.9 % 250 ML    Inject 1,000 mg into the vein every 12 (twelve) hours.  Modified Medications   No medications on file  Discontinued Medications   No medications on file    SIGNIFICANT DIAGNOSTIC EXAMS  05-14-14: ct of head: atrophy with patchy periventricular small vessel disease. No intracranial mass; hemorrhage; or acute appearing infarct  05-14-14: chest x-ray: central catheter tip in the right atrium near cavo-atrial junction. No pneumothorax. Slight generalized interstitial edema without airspace consolidation. Chronic rotator cuff tears bilaterally    05-15-14: chest x-ray: mild developing interstitial change in the left lower lobe. This could represent atelectasis or developing infiltrate.   05-16-14: TEE: EF 55-60%; normal global left ventricular systolic function. Moderate thickening and calcification of the anterior and posterior mitral valve leaflets. Moderate to severe mitral valve regurgitation. Mild aortic valve sclerosis without stenosis. Mildly elevated pulmonary artery systolic pressure.   05-17-14: abdominal ultrasound: changes suggestive of cirrhosis of the liver with mild ascites.     LABS REVIEWED:   05-14-14: glucose  97; bun 23; creat 1.8; k+3.4; na++135; t bili 1.4; albumin 2.3 lactic acid 4.4 05-15-14: ammonia: 34 05-18-14: glucose 86; bun 9; creat 0.71; k+3.1; na++143 mag 1.6  05-20-14: wbc 9.7; hgb 8.8; hct 26.0; mcv 109; plt 50; glucose 88; bun 7;creat 0.59; k+3.4; na++139 mag 1.8       Review of Systems  Constitutional: Positive for malaise/fatigue.  Respiratory: Positive for shortness of breath. Negative for cough.   Cardiovascular: Positive for leg swelling. Negative for chest pain and palpitations.  Gastrointestinal: Negative for heartburn and abdominal pain.  Musculoskeletal: Positive for back pain.    Skin: Negative.   Psychiatric/Behavioral: The patient is not nervous/anxious.      Physical Exam  Constitutional: He appears well-developed and well-nourished. No distress.  Neck: Neck supple. No JVD present.  Cardiovascular: Normal rate and regular rhythm.   Pedal pulses are faint   Respiratory: Effort normal. No respiratory distress. He has no wheezes.  Breath sounds are diminished   GI: Soft. Bowel sounds are normal.  Has ascites present   Musculoskeletal: He exhibits edema.  Has 1-2+ lower extremity edema. Has stiffness present in all extremities. Is resistant to range of motion   Neurological: He is alert.  Skin: Skin is warm and dry. He is not diaphoretic. There is pallor.  lower legs discolored due to pvd      ASSESSMENT/ PLAN:  1. MRSA bacteremia: he is presently on vancomycin IV per pharmacy dosing for 42 days; will continue to monitor his status he is followed by I/D.  2. C-diff infection: will continue him on flagyl 500 mg three time daily for one week after he completes the vancomycin IV will monitor his status  3. Hepatic encephalopathy and liver cirrhosis: will continue his lactulose 60 cc twice daily and will continue to monitor his his liver function and ammonia level.   4.  GERD: will continue protonix 40 mg twice daily   5. Liver cirrhosis: will continue demadex 20 mg twice daily  6. Hypokalemia: will continue k+ 10 meq daily   7. DVT and status post aortic valve replacement with thrombocytopenia: he is not on coumadin due to low platelet count of 50,000; will not make changes will monitor his status   Will check cbc; cmp; mag   Time spent with patient 50 minutes       Synthia Innocenteborah Green NP [facility]  Contact [phone] Monday through Friday 8am- 5pm  After hours call [phone]

## 2014-05-22 LAB — CULTURE, BLOOD (SINGLE)

## 2014-05-23 ENCOUNTER — Other Ambulatory Visit: Payer: Self-pay

## 2014-05-23 DIAGNOSIS — K7581 Nonalcoholic steatohepatitis (NASH): Secondary | ICD-10-CM | POA: Insufficient documentation

## 2014-05-23 DIAGNOSIS — I82409 Acute embolism and thrombosis of unspecified deep veins of unspecified lower extremity: Secondary | ICD-10-CM | POA: Insufficient documentation

## 2014-05-23 DIAGNOSIS — Z952 Presence of prosthetic heart valve: Secondary | ICD-10-CM | POA: Insufficient documentation

## 2014-05-23 DIAGNOSIS — D696 Thrombocytopenia, unspecified: Secondary | ICD-10-CM | POA: Insufficient documentation

## 2014-05-23 DIAGNOSIS — E876 Hypokalemia: Secondary | ICD-10-CM | POA: Insufficient documentation

## 2014-05-23 LAB — CBC WITH DIFFERENTIAL/PLATELET
BASOS PCT: 0.6 %
Basophil #: 0.1 10*3/uL (ref 0.0–0.1)
EOS PCT: 1.4 %
Eosinophil #: 0.2 10*3/uL (ref 0.0–0.7)
HCT: 23.4 % — ABNORMAL LOW (ref 40.0–52.0)
HGB: 8.1 g/dL — ABNORMAL LOW (ref 13.0–18.0)
LYMPHS ABS: 0.8 10*3/uL — AB (ref 1.0–3.6)
Lymphocyte %: 6.7 %
MCH: 37.2 pg — ABNORMAL HIGH (ref 26.0–34.0)
MCHC: 34.5 g/dL (ref 32.0–36.0)
MCV: 108 fL — AB (ref 80–100)
Monocyte #: 1.1 x10 3/mm — ABNORMAL HIGH (ref 0.2–1.0)
Monocyte %: 9.1 %
NEUTROS ABS: 9.8 10*3/uL — AB (ref 1.4–6.5)
Neutrophil %: 82.2 %
PLATELETS: 68 10*3/uL — AB (ref 150–440)
RBC: 2.16 10*6/uL — AB (ref 4.40–5.90)
RDW: 16.1 % — AB (ref 11.5–14.5)
WBC: 11.9 10*3/uL — ABNORMAL HIGH (ref 3.8–10.6)

## 2014-05-23 LAB — HEPATIC FUNCTION PANEL
ALT: 31 U/L (ref 10–40)
AST: 54 U/L — AB (ref 14–40)
Alkaline Phosphatase: 192 U/L — AB (ref 25–125)
Bilirubin, Total: 1.2 mg/dL

## 2014-05-23 LAB — BASIC METABOLIC PANEL
BUN: 15 mg/dL (ref 4–21)
Creatinine: 1.7 mg/dL — AB (ref 0.6–1.3)
Glucose: 76 mg/dL
Potassium: 3 mmol/L — AB (ref 3.4–5.3)
Sodium: 141 mmol/L (ref 137–147)

## 2014-05-23 LAB — MAGNESIUM
Magnesium: 1.4 mg/dL — ABNORMAL LOW
Magnesium: 1.4 mg/dL — AB (ref 1.6–2.4)

## 2014-05-23 LAB — COMPREHENSIVE METABOLIC PANEL
ALK PHOS: 192 U/L — AB
Albumin: 1.8 g/dL — ABNORMAL LOW (ref 3.4–5.0)
Anion Gap: 11 (ref 7–16)
BILIRUBIN TOTAL: 1.2 mg/dL — AB (ref 0.2–1.0)
BUN: 15 mg/dL (ref 7–18)
CREATININE: 1.7 mg/dL — AB (ref 0.60–1.30)
Calcium, Total: 7.6 mg/dL — ABNORMAL LOW (ref 8.5–10.1)
Chloride: 107 mmol/L (ref 98–107)
Co2: 23 mmol/L (ref 21–32)
EGFR (African American): 46 — ABNORMAL LOW
GFR CALC NON AF AMER: 40 — AB
GLUCOSE: 76 mg/dL (ref 65–99)
Osmolality: 281 (ref 275–301)
Potassium: 3 mmol/L — ABNORMAL LOW (ref 3.5–5.1)
SGOT(AST): 54 U/L — ABNORMAL HIGH (ref 15–37)
SGPT (ALT): 31 U/L (ref 12–78)
Sodium: 141 mmol/L (ref 136–145)
Total Protein: 5.6 g/dL — ABNORMAL LOW (ref 6.4–8.2)

## 2014-05-23 LAB — CBC AND DIFFERENTIAL
HCT: 23 % — AB (ref 41–53)
Hemoglobin: 8.1 g/dL — AB (ref 13.5–17.5)
PLATELETS: 68 10*3/uL — AB (ref 150–399)
WBC: 11.9 10*3/mL

## 2014-05-23 LAB — VANCOMYCIN, TROUGH: VANCOMYCIN, TROUGH: 28 ug/mL — AB (ref 10–20)

## 2014-05-23 LAB — AMMONIA: Ammonia, Plasma: 22 mcmol/L (ref 11–32)

## 2014-05-24 ENCOUNTER — Other Ambulatory Visit: Payer: Self-pay | Admitting: *Deleted

## 2014-05-24 ENCOUNTER — Non-Acute Institutional Stay (SKILLED_NURSING_FACILITY): Payer: Commercial Managed Care - HMO | Admitting: Internal Medicine

## 2014-05-24 ENCOUNTER — Encounter: Payer: Self-pay | Admitting: Internal Medicine

## 2014-05-24 DIAGNOSIS — A4902 Methicillin resistant Staphylococcus aureus infection, unspecified site: Secondary | ICD-10-CM

## 2014-05-24 DIAGNOSIS — D696 Thrombocytopenia, unspecified: Secondary | ICD-10-CM

## 2014-05-24 DIAGNOSIS — R5381 Other malaise: Secondary | ICD-10-CM

## 2014-05-24 DIAGNOSIS — K219 Gastro-esophageal reflux disease without esophagitis: Secondary | ICD-10-CM

## 2014-05-24 DIAGNOSIS — A0472 Enterocolitis due to Clostridium difficile, not specified as recurrent: Secondary | ICD-10-CM

## 2014-05-24 DIAGNOSIS — M159 Polyosteoarthritis, unspecified: Secondary | ICD-10-CM

## 2014-05-24 DIAGNOSIS — K7682 Hepatic encephalopathy: Secondary | ICD-10-CM

## 2014-05-24 DIAGNOSIS — R7881 Bacteremia: Secondary | ICD-10-CM

## 2014-05-24 DIAGNOSIS — R5383 Other fatigue: Secondary | ICD-10-CM

## 2014-05-24 DIAGNOSIS — K7290 Hepatic failure, unspecified without coma: Secondary | ICD-10-CM

## 2014-05-24 DIAGNOSIS — R609 Edema, unspecified: Secondary | ICD-10-CM

## 2014-05-24 DIAGNOSIS — R531 Weakness: Secondary | ICD-10-CM

## 2014-05-24 LAB — BASIC METABOLIC PANEL
BUN: 20 mg/dL (ref 4–21)
CREATININE: 1.4 mg/dL — AB (ref 0.6–1.3)
GLUCOSE: 70 mg/dL
POTASSIUM: 3.3 mmol/L — AB (ref 3.4–5.3)
Sodium: 137 mmol/L (ref 137–147)

## 2014-05-24 LAB — CBC AND DIFFERENTIAL
HCT: 22 % — AB (ref 41–53)
HEMOGLOBIN: 7.3 g/dL — AB (ref 13.5–17.5)
Platelets: 65 10*3/uL — AB (ref 150–399)
WBC: 10.7 10^3/mL

## 2014-05-24 LAB — AMMONIA: Ammonia: 43

## 2014-05-24 LAB — HEPATIC FUNCTION PANEL
ALT: 22 U/L (ref 10–40)
AST: 49 U/L — AB (ref 14–40)
Alkaline Phosphatase: 144 U/L — AB (ref 25–125)
Bilirubin, Total: 1.3 mg/dL

## 2014-05-24 LAB — ALBUMIN: Albumin: 1.9

## 2014-05-24 LAB — MAGNESIUM: Magnesium: 1.5 mg/dL — AB (ref 1.6–2.4)

## 2014-05-24 MED ORDER — TRAMADOL HCL 50 MG PO TABS: ORAL_TABLET | ORAL | Status: DC

## 2014-05-24 NOTE — Progress Notes (Signed)
Patient ID: Victor [name], male   DOB: [birth year], 73 y.o.   MRN: [id number]     Facility[facility]    PCP: No primary provider on file.  Code Status: DNR  Allergies  Allergen Reactions  . Zoloft [Sertraline Hcl]     Chief Complaint: new admission  HPI:  73 y/o male patient is here for STR after hospital admission 05/14/14-05/20/14 with MRSA bacteremia. cdiff colitis and hepatic encephalopathy. He has history of liver cirrhosis (NASH vs autoimmune). He is followed by palliative care services at present in SNF. Comfort care is the main goals of care as per his son who is at his bedside. Patient mentions aching at multiple joints. He is alert and oriented to person and place today. He feels weak and tired. Difficult to obtain ROS from patient. He denies further loose stool  Review of Systems:  Constitutional: Negative for fever, chills and diaphoresis.  HENT: Negative for congestion Respiratory: has cough and shortness of breath  Cardiovascular: Negative for chest pain, palpitations. Has leg swelling.  Gastrointestinal: Negative for heartburn, nausea, vomiting, abdominal pain Genitourinary: has foley Musculoskeletal: back and joint pain present Skin: Negative for itching and rash.   Past Medical History  Diagnosis Date  . Enteritis due to Clostridium difficile 05/21/2014  . Encephalopathy, hepatic 05/21/2014  . MRSA bacteremia 05/21/2014  . Hyperlipidemia   . Liver cirrhosis secondary to NASH (nonalcoholic steatohepatitis)   . Anemia   . Thrombocytopenia   . GI bleed   . Hypertension   . DVT (deep venous thrombosis)   . Septic shock   . Aspiration pneumonia   . Renal insufficiency   . CAD (coronary artery disease)   . Sleep apnea    Past Surgical History  Procedure Laterality Date  . Right knee replacement    . Carpal tunnel release    . Aortic valve replacement    . Back surgery     Social History:   reports that he has quit smoking.  He does not have any smokeless tobacco history on file. His alcohol and drug histories are not on file.  No family history on file.  Medications: Patient's Medications  New Prescriptions   No medications on file  Previous Medications   LACTULOSE, ENCEPHALOPATHY, (CHRONULAC) 10 GM/15ML SOLN    Take 40 g by mouth 2 (two) times daily.   METRONIDAZOLE (FLAGYL) 500 MG TABLET    Take 500 mg by mouth 3 (three) times daily.   MULTIPLE VITAMINS-MINERALS (CENTRUM SILVER ADULT 50+ PO)    Take 1 tablet by mouth daily.   PANTOPRAZOLE (PROTONIX) 40 MG TABLET    Take 40 mg by mouth 2 (two) times daily.   POTASSIUM CHLORIDE (K-DUR,KLOR-CON) 10 MEQ TABLET    Take 10 mEq by mouth daily.   TORSEMIDE (DEMADEX) 20 MG TABLET    Take 20 mg by mouth 2 (two) times daily.   TRAMADOL (ULTRAM) 50 MG TABLET    Take one tablet by mouth every 6 hours as needed for pain   VANCOMYCIN 1,000 MG IN SODIUM CHLORIDE 0.9 % 250 ML    Inject 1,000 mg into the vein every 12 (twelve) hours.  Modified Medications   No medications on file  Discontinued Medications   No medications on file     Physical Exam: Filed Vitals:   05/24/14 1647  BP: 116/88  Pulse: 74  Temp: 98.1 F (36.7 C)  Resp: 18  SpO2: 98%    General- elderly  frail male in no acute distress Head- atraumatic, normocephalic Eyes- PERRLA, EOMI, mild icterus Neck- no lymphadenopathy, right neck EJ catheter present Throat- moist mucus membrane, no pallor Cardiovascular- normal s1,s2, no murmurs/ rubs/ gallops Respiratory- poor air entry to bases, no wheeze or rhonchi Abdomen- bowel sounds present, soft, non tender Musculoskeletal- able to move all 4 extremities, generalized weakness, 1+ bilateral leg edema Neurological- no new focal deficit, alert and oriented to person  Skin- warm and dry, chronic skin changes in lower legs Psychiatry- alert and oriented to person, normal mood and affect  Labs reviewed: Basic Metabolic Panel:  Recent Labs  16/08/9605/19/15  05/18/14 05/20/14  NA 135* 143 139  K 3.4 3.1* 3.4  BUN 23* 9 7  CREATININE 1.8* 0.7 0.6  MG  --  1.8  --    Liver Function Tests:  Recent Labs  05/14/14  AST 53*  ALT 24  ALKPHOS 101   No results found for this basename: LIPASE, AMYLASE,  in the last 8760 hours No results found for this basename: AMMONIA,  in the last 8760 hours CBC:  Recent Labs  05/20/14  WBC 9.7  HGB 8.8*  HCT 26*  PLT 50*   Radiological Exams: 05-14-14: glucose 97; bun 23; creat 1.8; k+3.4; na++135; t bili 1.4; albumin 2.3 lactic acid 4.4 05-15-14: ammonia: 34 05-18-14: glucose 86; bun 9; creat 0.71; k+3.1; na++143 mag 1.6   05-20-14: wbc 9.7; hgb 8.8; hct 26.0; mcv 109; plt 50; glucose 88; bun 7;creat 0.59; k+3.4; na++139 mag 1.8   Assessment/Plan  Generalized weakness Will have him work with physical therapy and occupational therapy team to help with gait training and muscle strengthening exercises.fall precautions. Skin care. Encourage to be out of bed.   MRSA bacteremia Under contact precautions, continue skin and iv line care. Complete course of iv vancomycin and has follow up with ID. Monitor for fever and worsening confusion  C.diff colitis Complete course of flagyl 500 mg three time daily for one week after completion of iv  Vancomycin. On contact precaution for now  Hepatic encephalopathy  In setting of liver cirrhosis. continue his lactulose 60 cc twice daily. Is alert and oriented to person today which as per staff is an improvement  Thrombocytopenia Liver cirrhosis, being on antibiotic all could be contributing to this. Monitor for bleeding and platelet count  Generalized aches From his history of OA, limited mobility and being in bed mostly. Avoid NSAIDs given his low platelet count. Will change his ultram to 50 mg bid with q6h prn for now to help control pain to some extent to allow participation in therapy. Reassess  GERD continue protonix 40 mg twice daily   Edema Continue  demadex and monitor renal function. Continue kcl supplement. Monitor bmp   Family/ staff Communication: reviewed care plan with patient and nursing supervisor   Labs/tests ordered- cbc, cmp, mg    Victor GroutMAHIMA Chaitanya Amedee, MD  [facility] [phone] (Monday-Friday 8 am - 5 pm) [phone] (afterhours)

## 2014-05-24 NOTE — Telephone Encounter (Signed)
[facility] 

## 2014-05-25 ENCOUNTER — Non-Acute Institutional Stay (SKILLED_NURSING_FACILITY): Payer: Commercial Managed Care - HMO | Admitting: Adult Health

## 2014-05-25 DIAGNOSIS — E876 Hypokalemia: Secondary | ICD-10-CM

## 2014-05-25 DIAGNOSIS — E43 Unspecified severe protein-calorie malnutrition: Secondary | ICD-10-CM

## 2014-05-26 ENCOUNTER — Ambulatory Visit: Payer: Self-pay | Admitting: Oncology

## 2014-05-30 ENCOUNTER — Other Ambulatory Visit: Payer: Self-pay | Admitting: Adult Health

## 2014-05-30 LAB — CREATININE, SERUM
CREATININE: 1.9 mg/dL — AB (ref 0.60–1.30)
EGFR (Non-African Amer.): 35 — ABNORMAL LOW
GFR CALC AF AMER: 40 — AB

## 2014-05-30 LAB — VANCOMYCIN, TROUGH: Vancomycin, Trough: 40 ug/mL (ref 10–20)

## 2014-05-30 LAB — BUN: BUN: 27 mg/dL — ABNORMAL HIGH (ref 7–18)

## 2014-05-31 DIAGNOSIS — E43 Unspecified severe protein-calorie malnutrition: Secondary | ICD-10-CM | POA: Insufficient documentation

## 2014-05-31 MED ORDER — PRO-STAT SUGAR FREE PO LIQD: 30.0000 mL | Freq: Three times a day (TID) | ORAL | Status: AC

## 2014-05-31 MED ORDER — POTASSIUM CHLORIDE CRYS ER 10 MEQ PO TBCR: 20.0000 meq | EXTENDED_RELEASE_TABLET | Freq: Two times a day (BID) | ORAL | Status: AC

## 2014-05-31 MED ORDER — MAGNESIUM OXIDE 400 MG PO TABS: 400.0000 mg | ORAL_TABLET | Freq: Every day | ORAL | Status: AC

## 2014-05-31 NOTE — Progress Notes (Signed)
Patient ID: Victor [name], male   DOB: 25-May-1941, 73 y.o.   MRN: [id number]     [facility]  Allergies  Allergen Reactions  . Zoloft [Sertraline Hcl]      Chief Complaint  Patient presents with  . Acute Visit    follow up lab results     HPI:  His k+ is low as well as his mag level. His albumin is very low at 1.9. This makes wound healing for hime very difficult. There are no concerns being voiced by the nursing staff at this time.    Past Medical History  Diagnosis Date  . Enteritis due to Clostridium difficile 05/21/2014  . Encephalopathy, hepatic 05/21/2014  . MRSA bacteremia 05/21/2014  . Hyperlipidemia   . Liver cirrhosis secondary to NASH (nonalcoholic steatohepatitis)   . Anemia   . Thrombocytopenia   . GI bleed   . Hypertension   . DVT (deep venous thrombosis)   . Septic shock   . Aspiration pneumonia   . Renal insufficiency   . CAD (coronary artery disease)   . Sleep apnea     Past Surgical History  Procedure Laterality Date  . Right knee replacement    . Carpal tunnel release    . Aortic valve replacement    . Back surgery      VITAL SIGNS BP 123/68  Pulse 70   Patient's Medications  New Prescriptions   No medications on file  Previous Medications   LACTULOSE, ENCEPHALOPATHY, (CHRONULAC) 10 GM/15ML SOLN    Take 40 g by mouth 2 (two) times daily.   METRONIDAZOLE (FLAGYL) 500 MG TABLET    Take 500 mg by mouth 3 (three) times daily.   MULTIPLE VITAMINS-MINERALS (CENTRUM SILVER ADULT 50+ PO)    Take 1 tablet by mouth daily.   PANTOPRAZOLE (PROTONIX) 40 MG TABLET    Take 40 mg by mouth 2 (two) times daily.   POTASSIUM CHLORIDE (K-DUR,KLOR-CON) 10 MEQ TABLET    Take 10 mEq by mouth daily.   TORSEMIDE (DEMADEX) 20 MG TABLET    Take 20 mg by mouth 2 (two) times daily.   VANCOMYCIN 1,000 MG IN SODIUM CHLORIDE 0.9 % 250 ML    Inject 1,000 mg into the vein every 12 (twelve) hours.  Modified Medications   Modified Medication Previous Medication   TRAMADOL (ULTRAM) 50 MG TABLET traMADol (ULTRAM) 50 MG tablet      Take 50 mg by mouth 2 (two) times daily. AND one tablet by mouth every 6 hours as needed for pain    Take one tablet by mouth every 6 hours as needed for pain  Discontinued Medications   No medications on file    SIGNIFICANT DIAGNOSTIC EXAMS  05-14-14: ct of head: atrophy with patchy periventricular small vessel disease. No intracranial mass; hemorrhage; or acute appearing infarct  05-14-14: chest x-ray: central catheter tip in the right atrium near cavo-atrial junction. No pneumothorax. Slight generalized interstitial edema without airspace consolidation. Chronic rotator cuff tears bilaterally    05-15-14: chest x-ray: mild developing interstitial change in the left lower lobe. This could represent atelectasis or developing infiltrate.   05-16-14: TEE: EF 55-60%; normal global left ventricular systolic function. Moderate thickening and calcification of the anterior and posterior mitral valve leaflets. Moderate to severe mitral valve regurgitation. Mild aortic valve sclerosis without stenosis. Mildly elevated pulmonary artery systolic pressure.   05-17-14: abdominal ultrasound: changes suggestive of cirrhosis of the liver with mild ascites.     LABS REVIEWED:  05-14-14: glucose 97; bun 23; creat 1.8; k+3.4; na++135; t bili 1.4; albumin 2.3 lactic acid 4.4 05-15-14: ammonia: 34 05-18-14: glucose 86; bun 9; creat 0.71; k+3.1; na++143 mag 1.6  05-20-14: wbc 9.7; hgb 8.8; hct 26.0; mcv 109; plt 50; glucose 88; bun 7;creat 0.59; k+3.4; na++139 mag 1.8  05-23-14: wbc 11.9; hgb 8.1; hct 23.4; mcv 108; plt 68; glucose 76; bun 15; creat 1.7; k+3.0; na++141; alk phos 192 alt 31 ast 54; t bili 1.2; albumin 1.8; mag 1.4 05-24-14: wbc 10.7; hgb 7.3; hct 22.2; mcv 109.9; plt 65; glucose 70; bun 20; creat 1.4; k+3.3; na++137; alk phos 144; ast 49; ast 22; t bili 1.3; albumin 1.9; ammonia 43; mag 1.5       Review of Systems  Constitutional:  Positive for malaise/fatigue.  Respiratory: Positive for shortness of breath. Negative for cough.   Cardiovascular: Positive for leg swelling. Negative for chest pain and palpitations.  Gastrointestinal: Negative for heartburn and abdominal pain.  Musculoskeletal: Positive for back pain.  Skin: Negative.   Psychiatric/Behavioral: The patient is not nervous/anxious.      Physical Exam  Constitutional: He appears well-developed and well-nourished. No distress.  Neck: Neck supple. No JVD present.  Cardiovascular: Normal rate and regular rhythm.   Pedal pulses are faint   Respiratory: Effort normal. No respiratory distress. He has no wheezes.  Breath sounds are diminished   GI: Soft. Bowel sounds are normal.  Has ascites present   Musculoskeletal: He exhibits edema.  Has 1-2+ lower extremity edema. Has stiffness present in all extremities. Is resistant to range of motion   Neurological: He is alert.  Skin: Skin is warm and dry. He is not diaphoretic. There is pallor.  lower legs discolored due to pvd      ASSESSMENT/ PLAN:  1. Hypokalemia: will increase his k+ to 20 meq twice daily   2. Hypomagnesemia: will being mag ox 400 mg daily   3. Protein malnutrition: for his albumin 1.9 will begin prostat 30 cc three times daily   4. Anemia: without change in status; will monitor   On 05-31-14: will check bmp and mag

## 2014-06-02 LAB — BASIC METABOLIC PANEL
BUN: 40 mg/dL — AB (ref 4–21)
Creatinine: 2.3 mg/dL — AB (ref <=1.3)

## 2014-06-03 ENCOUNTER — Non-Acute Institutional Stay (SKILLED_NURSING_FACILITY): Payer: Commercial Managed Care - HMO | Admitting: Adult Health

## 2014-06-03 ENCOUNTER — Encounter: Payer: Self-pay | Admitting: Adult Health

## 2014-06-03 DIAGNOSIS — A4902 Methicillin resistant Staphylococcus aureus infection, unspecified site: Secondary | ICD-10-CM

## 2014-06-03 DIAGNOSIS — R7881 Bacteremia: Secondary | ICD-10-CM

## 2014-06-03 DIAGNOSIS — A0472 Enterocolitis due to Clostridium difficile, not specified as recurrent: Secondary | ICD-10-CM

## 2014-06-03 DIAGNOSIS — K7581 Nonalcoholic steatohepatitis (NASH): Secondary | ICD-10-CM

## 2014-06-03 DIAGNOSIS — D696 Thrombocytopenia, unspecified: Secondary | ICD-10-CM

## 2014-06-03 DIAGNOSIS — K7689 Other specified diseases of liver: Secondary | ICD-10-CM

## 2014-06-03 LAB — BASIC METABOLIC PANEL
BUN: 42 mg/dL — AB (ref 4–21)
CREATININE: 2.3 mg/dL — AB (ref <=1.3)
Potassium: 3.7 mmol/L (ref 3.4–5.3)
Sodium: 139 mmol/L (ref 137–147)

## 2014-06-03 NOTE — Progress Notes (Signed)
Patient ID: Victor [name], male   DOB: [birth year], 73 y.o.   MRN: [id number]   [place] (29)  Code Status:  DNR  Chief Complaint  Patient presents with  . f/u dehdration, IJ pulled out    HPI: This is a 73 y.o. 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 He also was diagnosed with cdiff and hepatic encephalopathy. Last night He was ordered 1L of NS secondary to a BUN/Cr of 40/2.3 and some confusion. At some point his right IJ was pulled out accidentally. The resident reports diarrhea two times a day. He is eating and drinking by his account but he is a poor historian. The staff says he is the most alert he has been since he was admitted.  He is up in the chair for our visit and answers most questions appropriately. He denies abd pain or nausea. He denies CP, SOB, or DOE.  He does reports some dizziness when standing and his BP has been low at times in the facility.     Allergies  Allergen Reactions  . Zoloft [Sertraline Hcl]     MEDICATIONS -  Reviewed  DATA REVIEWED  05-14-14: ct of head: atrophy with patchy periventricular small vessel disease. No intracranial mass; hemorrhage; or acute appearing infarct   05-14-14: chest x-ray: central catheter tip in the right atrium near cavo-atrial junction. No pneumothorax. Slight generalized interstitial edema without airspace consolidation. Chronic rotator cuff tears bilaterally   05-15-14: chest x-ray: mild developing interstitial change in the left lower lobe. This could represent atelectasis or developing infiltrate.   05-16-14: TEE: EF 55-60%; normal global left ventricular systolic function. Moderate thickening and calcification of the anterior and posterior mitral valve leaflets. Moderate to severe mitral valve regurgitation. Mild aortic valve sclerosis without stenosis. Mildly elevated pulmonary artery systolic pressure.   05-17-14:  abdominal ultrasound: changes suggestive of cirrhosis of the liver with mild ascites.   Laboratory Studies   LABS REVIEWED:   05-14-14: glucose 97; bun 23; creat 1.8; k+3.4; na++135; t bili 1.4; albumin 2.3 lactic acid 4.4 05-15-14: ammonia: 34 05-18-14: glucose 86; bun 9; creat 0.71; k+3.1; na++143 mag 1.6  05-20-14: wbc 9.7; hgb 8.8; hct 26.0; mcv 109; plt 50; glucose 88; bun 7;creat 0.59; k+3.4; na++139 mag 1.8  05-23-14: wbc 11.9; hgb 8.1; hct 23.4; mcv 108; plt 68; glucose 76; bun 15; creat 1.7; k+3.0; na++141; alk phos 192 alt 31 ast 54; t bili 1.2; albumin 1.8; mag 1.4 05-24-14: wbc 10.7; hgb 7.3; hct 22.2; mcv 109.9; plt 65; glucose 70; bun 20; creat 1.4; k+3.3; na++137; alk phos 144; ast 49; ast 22; t bili 1.3; albumin 1.9; ammonia 43; mag 1.5           REVIEW OF SYSTEMS  DATA OBTAINED: from patient, nurse, medical record, poor historian GENERAL: No recent fever, change in activity status, appetite, or weight. Fatigue noted EYES: NO change in vision, blurry vision, discharge or eye pain RESPIRATORY: No cough, wheezing, SOB, DOE. CARDIAC: No chest pain, palpitations. No edema GI: No abdominal pain  No Nausea,vomiting. Intermittent diarrhea MUSCULOSKELETAL: No joint pain, swelling or stiffness today. Pain on his bottom secondary pressure ulcer NEUROLOGIC:Dizziness when standing. Improved mental status with intermittent confusion. No syncope   PHYSICAL EXAM Filed Vitals:   06/03/14 1041  BP: 124/68  Pulse: 53  Temp: 97.2 F (36.2 C)  Resp: 15  Weight: 169 lb (76.658 kg)  SpO2: 98%  on 2L [place] There is no height on file to calculate BMI. GENERAL APPEARANCE: No acute distress, appropriately groomed, normal body habitus Alert, pleasant, conversant. SKIN: No diaphoresis. Thin skin with multiple areas of ecchymosis HEAD: Normocephalic, atraumatic EYES: Conjunctiva/lids clear  RESPIRATORY: Breathing is even, unlabored  Decreased bases with insp crackles hear anteriorally L>R.  On 2L [place] CARDIOVASCULAR: Heart RRR   No murmur or extra heart sounds   EDEMA: No peripheral edema   MUSCULOSKELETAL: overall decreased strength 4/5 PSYCHIATRIC: Mood and affect appropriate to situation. Alert and oriented to self, situation.   ASSESSMENT/PLAN   1)Dehydration: He looks volume depleted. He will need gentle rehydration. Will hold Demedex and re evaluate with next visit. Give NS 1.5 L at 75 cc/hr. Then recheck BMP.  2)C diff: He reports 2 episodes a day. This seems to be improving but it is hard to gauge since he is Lactulose. Continue Flagyl as ordered  3) Hepatic Encephalopathy: Reduce Lactulose to 30cc BID and recheck ammonia level on Monday 06/07/14. Record BMs qshift for provider to review. We need to be able to evaluate his diarrhea more accurately and avoid dehydration.   4) Anemia/Thrombocytopenia: recheck CBC on Monday and monitor for signs of bleeding.  5) MRSA septicemia: Vanc is currently on hold due to high trough levels on renal failure. He is having a PICC placed to hopefully finish the course of tx.  Pharmacy to follow the vanc for dosing. If there is no improvement in his renal function, will consult with ID and choose another agent   Bard Herbert NP/Christina Wert NP 06/03/2014

## 2014-06-04 ENCOUNTER — Other Ambulatory Visit: Payer: Self-pay | Admitting: Adult Health

## 2014-06-04 LAB — CBC WITH DIFFERENTIAL/PLATELET
BASOS PCT: 1.2 %
Basophil #: 0.1 10*3/uL (ref 0.0–0.1)
Eosinophil #: 0.2 10*3/uL (ref 0.0–0.7)
Eosinophil %: 3.6 %
HCT: 23.7 % — ABNORMAL LOW (ref 40.0–52.0)
HGB: 7.8 g/dL — AB (ref 13.0–18.0)
Lymphocyte #: 0.5 10*3/uL — ABNORMAL LOW (ref 1.0–3.6)
Lymphocyte %: 8.6 %
MCH: 37.5 pg — ABNORMAL HIGH (ref 26.0–34.0)
MCHC: 33 g/dL (ref 32.0–36.0)
MCV: 114 fL — AB (ref 80–100)
Monocyte #: 0.7 x10 3/mm (ref 0.2–1.0)
Monocyte %: 12 %
Neutrophil #: 4.6 10*3/uL (ref 1.4–6.5)
Neutrophil %: 74.6 %
PLATELETS: 71 10*3/uL — AB (ref 150–440)
RBC: 2.09 10*6/uL — ABNORMAL LOW (ref 4.40–5.90)
RDW: 17.8 % — AB (ref 11.5–14.5)
WBC: 6.2 10*3/uL (ref 3.8–10.6)

## 2014-06-04 LAB — BASIC METABOLIC PANEL
Anion Gap: 8 (ref 7–16)
BUN: 40 mg/dL — ABNORMAL HIGH (ref 7–18)
CALCIUM: 8 mg/dL — AB (ref 8.5–10.1)
CO2: 29 mmol/L (ref 21–32)
Chloride: 104 mmol/L (ref 98–107)
Creatinine: 2.44 mg/dL — ABNORMAL HIGH (ref 0.60–1.30)
EGFR (Non-African Amer.): 26 — ABNORMAL LOW
GFR CALC AF AMER: 30 — AB
Glucose: 115 mg/dL — ABNORMAL HIGH (ref 65–99)
OSMOLALITY: 292 (ref 275–301)
Potassium: 3.6 mmol/L (ref 3.5–5.1)
Sodium: 141 mmol/L (ref 136–145)

## 2014-06-06 ENCOUNTER — Other Ambulatory Visit: Payer: Self-pay

## 2014-06-06 LAB — CREATININE, SERUM
CREATININE: 2.19 mg/dL — AB (ref 0.60–1.30)
EGFR (African American): 34 — ABNORMAL LOW
GFR CALC NON AF AMER: 29 — AB

## 2014-06-06 LAB — BUN: BUN: 33 mg/dL — ABNORMAL HIGH (ref 7–18)

## 2014-06-07 ENCOUNTER — Non-Acute Institutional Stay (SKILLED_NURSING_FACILITY): Payer: Commercial Managed Care - HMO | Admitting: Adult Health

## 2014-06-07 ENCOUNTER — Encounter: Payer: Self-pay | Admitting: Adult Health

## 2014-06-07 DIAGNOSIS — E860 Dehydration: Secondary | ICD-10-CM

## 2014-06-07 DIAGNOSIS — D696 Thrombocytopenia, unspecified: Secondary | ICD-10-CM

## 2014-06-07 DIAGNOSIS — A0472 Enterocolitis due to Clostridium difficile, not specified as recurrent: Secondary | ICD-10-CM

## 2014-06-07 DIAGNOSIS — D649 Anemia, unspecified: Secondary | ICD-10-CM

## 2014-06-07 DIAGNOSIS — R7881 Bacteremia: Secondary | ICD-10-CM

## 2014-06-07 DIAGNOSIS — K7290 Hepatic failure, unspecified without coma: Secondary | ICD-10-CM

## 2014-06-07 DIAGNOSIS — A4902 Methicillin resistant Staphylococcus aureus infection, unspecified site: Secondary | ICD-10-CM

## 2014-06-07 DIAGNOSIS — B9562 Methicillin resistant Staphylococcus aureus infection as the cause of diseases classified elsewhere: Secondary | ICD-10-CM

## 2014-06-07 DIAGNOSIS — K7682 Hepatic encephalopathy: Secondary | ICD-10-CM

## 2014-06-07 NOTE — Progress Notes (Signed)
Patient ID: Victor [name], male   DOB: [birth year], 73 y.o.   MRN: [id number]    [place] (48)  Code Status:  DNR  Chief Complaint  Patient presents with  . f/u dehdration, IJ pulled out    HPI: This is a 73 y.o. 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 He also was diagnosed with cdiff and hepatic encephalopathy. He was seen on 06/03/14 and dehydration was noted with a BUN of 40. He was ordered 1.5L of NS via PICC line. This was given but the follow up BMP was done prior to the administration of fluids I believe based on the time it was drawn. Later over the weekend another BUN/Cr was ordered 33/2.3. He has reported increased weakness over the weekend. He is somewhat confused and has been treated for diarrhea but he could not tell me how many BM's he has had over the past few days. This information could not be provided by the staff. He denies abd pain or nausea. The notes indicate that his appetite has been decreased according to ST. He is on lactulose for hepatic encephalopathy and this was decreased to assess his diarrhea better. F/U ammonia levels are pending. He does not appear more confused but does appear weaker.     Allergies  Allergen Reactions  . Zoloft [Sertraline Hcl]    Outpatient Encounter Prescriptions as of 06/07/2014  Medication Sig  . Amino Acids-Protein Hydrolys (FEEDING SUPPLEMENT, PRO-STAT SUGAR FREE 64,) LIQD Take 30 mLs by mouth 3 (three) times daily with meals.  . lactulose, encephalopathy, (CHRONULAC) 10 GM/15ML SOLN Take 40 g by mouth 2 (two) times daily.  . magnesium oxide (MAG-OX) 400 MG tablet Take 1 tablet (400 mg total) by mouth daily.  . metroNIDAZOLE (FLAGYL) 500 MG tablet Take 500 mg by mouth 3 (three) times daily.  . Multiple Vitamins-Minerals (CENTRUM SILVER ADULT 50+ PO) Take 1 tablet by mouth daily.  . pantoprazole (PROTONIX) 40 MG tablet Take 40 mg  by mouth 2 (two) times daily.  . potassium chloride (K-DUR,KLOR-CON) 10 MEQ tablet Take 2 tablets (20 mEq total) by mouth 2 (two) times daily.  Marland Kitchen torsemide (DEMADEX) 20 MG tablet Take 20 mg by mouth 2 (two) times daily.  . traMADol (ULTRAM) 50 MG tablet Take 50 mg by mouth 2 (two) times daily. AND one tablet by mouth every 6 hours as needed for pain  . vancomycin 1,000 mg in sodium chloride 0.9 % 250 mL Inject 1,000 mg into the vein every 12 (twelve) hours.    DATA REVIEWED  05-14-14: ct of head: atrophy with patchy periventricular small vessel disease. No intracranial mass; hemorrhage; or acute appearing infarct   05-14-14: chest x-ray: central catheter tip in the right atrium near cavo-atrial junction. No pneumothorax. Slight generalized interstitial edema without airspace consolidation. Chronic rotator cuff tears bilaterally   05-15-14: chest x-ray: mild developing interstitial change in the left lower lobe. This could represent atelectasis or developing infiltrate.   05-16-14: TEE: EF 55-60%; normal global left ventricular systolic function. Moderate thickening and calcification of the anterior and posterior mitral valve leaflets. Moderate to severe mitral valve regurgitation. Mild aortic valve sclerosis without stenosis. Mildly elevated pulmonary artery systolic pressure.   05-17-14: abdominal ultrasound: changes suggestive of cirrhosis of the liver with mild ascites.   06/04/14: PCXR: PICC line in place. No signs of infiltrate or congestion   LABS REVIEWED:   05-14-14: glucose 97; bun  23; creat 1.8; k+3.4; na++135; t bili 1.4; albumin 2.3 lactic acid 4.4 05-15-14: ammonia: 34 05-18-14: glucose 86; bun 9; creat 0.71; k+3.1; na++143 mag 1.6  05-20-14: wbc 9.7; hgb 8.8; hct 26.0; mcv 109; plt 50; glucose 88; bun 7;creat 0.59; k+3.4; na++139 mag 1.8  05-23-14: wbc 11.9; hgb 8.1; hct 23.4; mcv 108; plt 68; glucose 76; bun 15; creat 1.7; k+3.0; na++141; alk phos 192 alt 31 ast 54; t bili 1.2;  albumin 1.8; mag 1.4 05-24-14: wbc 10.7; hgb 7.3; hct 22.2; mcv 109.9; plt 65; glucose 70; bun 20; creat 1.4; k+3.3; na++137; alk phos 144; ast 49; ast 22; t bili 1.3; albumin 1.9; ammonia 43; mag 1.5    REVIEW OF SYSTEMS  DATA OBTAINED: from patient, nurse, medical record, poor historian GENERAL: No recent fever. Increased weakness. Decreased appetite. Fatigue noted EYES: NO change in vision, blurry vision, discharge or eye pain RESPIRATORY: No cough, wheezing, SOB, DOE. CARDIAC: No chest pain, palpitations. No edema GI: No abdominal pain  No Nausea,vomiting. Intermittent diarrhea MUSCULOSKELETAL: Increased weakness and decreased ROM to shoulder per patient. NEUROLOGIC:Dizziness when standing. Improved mental status with intermittent confusion. No syncope   PHYSICAL EXAM 97/56 97 97.5 20 98.5  GENERAL APPEARANCE: No acute distress, appropriately groomed, frail body habitus Alert, pleasant, conversant. SKIN: No diaphoresis. Thin skin with multiple areas of ecchymosis . Dry mucous membranes. No skin tenting today. HEAD: Normocephalic, atraumatic EYES: Conjunctiva/lids clear. Pale conjuctiva RESPIRATORY: Breathing is even, unlabored . Clear and full BS bilat CARDIOVASCULAR: Heart RRR   No murmur or extra heart sounds   EDEMA: No peripheral edema   MUSCULOSKELETAL: overall decreased strength 3/5. Decreased ROM and stiffness to both shoulders PSYCHIATRIC: Mood and affect appropriate to situation. Alert and oriented to self, situation.   ASSESSMENT/PLAN   1)Dehydration: He looks volume depleted again today. His CXR is clear. Give 2L of D5 1/2 NS at 75 cc/hr then recheck BMP. We spoke with his wife that he should continue to force fluids and eat a more adequate meal  2)C diff: This is difficult to gauge without a better BM history. His abd exam is benign and he is afebrile. Continue Flagyl.  3) Hepatic Encephalopathy: Stable with no change in mental status with Lactulose reduction.  Check  Ammonia level  4) Anemia/Thrombocytopenia: Recheck CBC when the IVF are finished. He mostly likely will need a transfusion if <7. Also check B12, folate with next lab draw.  5) MRSA septicemia: Vanc is currently on hold due to high trough levels on renal failure. Continue to follow with pharmacy. If no improvement in RF may need another agent that is not nephro toxic.  6) Malnourishment: Continue prostat 30 cc BID. Continue MVI. Encourage meals and fluids.  His overall condition is poor. He will need close monitor and if there is no significant improvement he will need to return to the hospital.  Bard Herbert NP/Christina Wert NP 06/03/2014

## 2014-06-09 ENCOUNTER — Encounter: Payer: Self-pay | Admitting: Adult Health

## 2014-06-09 ENCOUNTER — Non-Acute Institutional Stay (SKILLED_NURSING_FACILITY): Payer: Commercial Managed Care - HMO | Admitting: Adult Health

## 2014-06-09 DIAGNOSIS — D649 Anemia, unspecified: Secondary | ICD-10-CM | POA: Insufficient documentation

## 2014-06-09 DIAGNOSIS — D6489 Other specified anemias: Secondary | ICD-10-CM

## 2014-06-09 NOTE — Assessment & Plan Note (Signed)
T and C and transfuse 2 units of blood for  Hgb of 7.0 and symptomatic

## 2014-06-09 NOTE — Progress Notes (Signed)
Patient ID: Victor [name], male   DOB: [birth year], 73 y.o.   MRN: [id number]    [place] (101)  Code Status:  DNR  Chief Complaint  Patient presents with  . f/u dehdration, IJ pulled out    HPI: This is a 73 y.o. 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 He also was diagnosed with cdiff and hepatic encephalopathy. He has received 2L of fluid for dehydration and his follow up BMP is pending. He has anemia and his Hgb for 06/09/2014 was 7.0. He is weak and has pale conjunctiva. He would benefit from transfusion. His iron studies are pending as well. He has no complaints for this visit.     Allergies  Allergen Reactions  . Zoloft [Sertraline Hcl]     MEDICATIONS -  Reviewed  DATA REVIEWED  05-14-14: ct of head: atrophy with patchy periventricular small vessel disease. No intracranial mass; hemorrhage; or acute appearing infarct   05-14-14: chest x-ray: central catheter tip in the right atrium near cavo-atrial junction. No pneumothorax. Slight generalized interstitial edema without airspace consolidation. Chronic rotator cuff tears bilaterally   05-15-14: chest x-ray: mild developing interstitial change in the left lower lobe. This could represent atelectasis or developing infiltrate.   05-16-14: TEE: EF 55-60%; normal global left ventricular systolic function. Moderate thickening and calcification of the anterior and posterior mitral valve leaflets. Moderate to severe mitral valve regurgitation. Mild aortic valve sclerosis without stenosis. Mildly elevated pulmonary artery systolic pressure.   05-17-14: abdominal ultrasound: changes suggestive of cirrhosis of the liver with mild ascites.   06/04/14: PCXR: PICC line in place. No signs of infiltrate or congestion   LABS REVIEWED:   05-14-14: glucose 97; bun 23; creat 1.8; k+3.4; na++135; t bili 1.4; albumin 2.3 lactic acid  4.4 05-15-14: ammonia: 34 05-18-14: glucose 86; bun 9; creat 0.71; k+3.1; na++143 mag 1.6  05-20-14: wbc 9.7; hgb 8.8; hct 26.0; mcv 109; plt 50; glucose 88; bun 7;creat 0.59; k+3.4; na++139 mag 1.8  05-23-14: wbc 11.9; hgb 8.1; hct 23.4; mcv 108; plt 68; glucose 76; bun 15; creat 1.7; k+3.0; na++141; alk phos 192 alt 31 ast 54; t bili 1.2; albumin 1.8; mag 1.4 05-24-14: wbc 10.7; hgb 7.3; hct 22.2; mcv 109.9; plt 65; glucose 70; bun 20; creat 1.4; k+3.3; na++137; alk phos 144; ast 49; ast 22; t bili 1.3; albumin 1.9; ammonia 43; mag 1.5  06-09-14: Hgb 7.0, WBC 6.2, MCV 117.3, plt 67,000  REVIEW OF SYSTEMS  DATA OBTAINED: from patient, nurse, medical record, poor historian GENERAL: No recent fever. Increased weakness. Decreased appetite. Fatigue noted EYES: NO change in vision, blurry vision, discharge or eye pain RESPIRATORY: No cough, wheezing, SOB, DOE. CARDIAC: No chest pain, palpitations. No edema GI: No abdominal pain  No Nausea,vomiting. Intermittent diarrhea MUSCULOSKELETAL: Increased weakness and decreased ROM to shoulder per patient. NEUROLOGIC:Dizziness when standing. Improved mental status with intermittent confusion. No syncope   PHYSICAL EXAM 97/56 97 97.5 20 98.5  GENERAL APPEARANCE: No acute distress, appropriately groomed, frail body habitus Alert, pleasant, conversant. SKIN: No diaphoresis. Thin skin with multiple areas of ecchymosis. Dry mucous membranes.  HEAD: Normocephalic, atraumatic EYES: Conjunctiva/lids clear. Pale conjuctiva RESPIRATORY: Breathing is even, unlabored . Clear and full BS bilat CARDIOVASCULAR: Heart RRR   No murmur or extra heart sounds   EDEMA: No peripheral edema   MUSCULOSKELETAL: overall decreased strength 3/5. Decreased ROM and stiffness to both shoulders PSYCHIATRIC: Mood and  affect appropriate to situation. Alert and oriented to self, situation.   ASSESSMENT/PLAN   1) Anemia: There are no signs of blood loss. Iron studies are pending. He  would benefit from transfusion given his relatively dry state and symptoms. His iron studies are pending at this time. Will order to T an C and transfuse two units of PRBC's to transfuse over 3hr each. He will have $RemoveB'650mg'IwoCpmWA$  of Tylenol prior to the transfusion. Await iron studies, folate and B12, and tx accordingly.   Bard Herbert NP/Christina Wert NP 06/03/2014

## 2014-06-10 ENCOUNTER — Other Ambulatory Visit (HOSPITAL_COMMUNITY): Payer: Self-pay

## 2014-06-11 ENCOUNTER — Ambulatory Visit (HOSPITAL_COMMUNITY)
Admission: RE | Admit: 2014-06-11 | Discharge: 2014-06-11 | Disposition: A | Discharge status: Home / Self Care | Payer: Medicare PPO | Source: Ambulatory Visit | Attending: Internal Medicine | Admitting: Internal Medicine

## 2014-06-11 DIAGNOSIS — A4902 Methicillin resistant Staphylococcus aureus infection, unspecified site: Secondary | ICD-10-CM | POA: Diagnosis not present

## 2014-06-11 DIAGNOSIS — D696 Thrombocytopenia, unspecified: Secondary | ICD-10-CM | POA: Diagnosis not present

## 2014-06-11 DIAGNOSIS — K7689 Other specified diseases of liver: Secondary | ICD-10-CM | POA: Insufficient documentation

## 2014-06-11 DIAGNOSIS — R7881 Bacteremia: Secondary | ICD-10-CM | POA: Diagnosis not present

## 2014-06-11 DIAGNOSIS — A0472 Enterocolitis due to Clostridium difficile, not specified as recurrent: Secondary | ICD-10-CM | POA: Diagnosis not present

## 2014-06-11 LAB — ABO/RH: ABO/RH(D): A POS

## 2014-06-11 LAB — PREPARE RBC (CROSSMATCH)

## 2014-06-11 MED ORDER — SODIUM CHLORIDE 0.9 % IV SOLN: 500.0000 mL | Freq: Once | INTRAVENOUS | Status: DC

## 2014-06-11 MED ORDER — ACETAMINOPHEN 325 MG PO TABS
650.0000 mg | ORAL_TABLET | Freq: Once | ORAL | Status: AC
  Administered 2014-06-11: 650 mg via ORAL

## 2014-06-11 MED ORDER — ACETAMINOPHEN 325 MG PO TABS
ORAL_TABLET | ORAL | Status: AC
Start: 2014-06-11 — End: 2014-06-11
  Filled 2014-06-11: qty 2

## 2014-06-11 MED ORDER — HEPARIN SOD (PORK) LOCK FLUSH 100 UNIT/ML IV SOLN
INTRAVENOUS | Status: AC
  Filled 2014-06-11: qty 5

## 2014-06-11 MED ORDER — HEPARIN SOD (PORK) LOCK FLUSH 100 UNIT/ML IV SOLN
250.0000 [IU] | INTRAVENOUS | Status: AC | PRN
Start: 2014-06-11 — End: 2014-06-11
  Administered 2014-06-11: 250 [IU]

## 2014-06-12 ENCOUNTER — Other Ambulatory Visit: Payer: Self-pay

## 2014-06-12 LAB — TYPE AND SCREEN
ABO/RH(D): A POS
Antibody Screen: NEGATIVE
UNIT DIVISION: 0
Unit division: 0
Unit division: 0

## 2014-06-12 LAB — CREATININE, SERUM
Creatinine: 1.49 mg/dL — ABNORMAL HIGH (ref 0.60–1.30)
GFR CALC AF AMER: 54 — AB
GFR CALC NON AF AMER: 47 — AB

## 2014-06-12 LAB — BUN: BUN: 30 mg/dL — ABNORMAL HIGH (ref 7–18)

## 2014-06-16 ENCOUNTER — Non-Acute Institutional Stay (SKILLED_NURSING_FACILITY): Payer: Commercial Managed Care - HMO | Admitting: Adult Health

## 2014-06-16 ENCOUNTER — Encounter: Payer: Self-pay | Admitting: Adult Health

## 2014-06-16 DIAGNOSIS — K7682 Hepatic encephalopathy: Secondary | ICD-10-CM

## 2014-06-16 DIAGNOSIS — K7290 Hepatic failure, unspecified without coma: Secondary | ICD-10-CM

## 2014-06-16 DIAGNOSIS — R7881 Bacteremia: Secondary | ICD-10-CM

## 2014-06-16 DIAGNOSIS — A4902 Methicillin resistant Staphylococcus aureus infection, unspecified site: Secondary | ICD-10-CM

## 2014-06-16 DIAGNOSIS — D696 Thrombocytopenia, unspecified: Secondary | ICD-10-CM

## 2014-06-16 DIAGNOSIS — A0472 Enterocolitis due to Clostridium difficile, not specified as recurrent: Secondary | ICD-10-CM

## 2014-06-16 DIAGNOSIS — E43 Unspecified severe protein-calorie malnutrition: Secondary | ICD-10-CM

## 2014-06-16 DIAGNOSIS — D649 Anemia, unspecified: Secondary | ICD-10-CM

## 2014-06-16 NOTE — Progress Notes (Signed)
Patient ID: Victor [name], male   DOB: 1941/06/12, 73 y.o.   MRN: [id number]    [place] (51)  Code Status:  DNR  Chief Complaint  Patient presents with  . f/u dehdration, IJ pulled out    HPI: This is a 73 y.o. 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 He also was diagnosed with cdiff and hepatic encephalopathy. He has been seen multiple times for dehydration and has received several liters of fluid. He is on vancomycin for MRSA bacteremia and this has been on hold due to ARF in the past but his kidney fxn improved and it was restarted, set to complete 06/19/14.  He was treated for cdiff with flagyl and the staff reports that his stool are one per shift and soft. He denies abd pain, nausea, or vomiting. He also is on lactulose for NASH. This was decreased two weeks ago and his ammonia has increased to 59 but his mental status has not changed. He is confused at baseline to the details of his care but can answer questions about his current situation and is pleasant.  He is very frail and has been losing weight, from 169lbs on admission to 145lbs currently. He has been on oxygen here in the facility due to anemia and the fact that he was dependent on it during his bout of sepsis in the hospital. He was transfused 2 units of PRBCs on 06/11/14 for a Hgb of 7.0 and it increased post transfusion to 9.7.     Allergies  Allergen Reactions  . Zoloft [Sertraline Hcl]    Outpatient Encounter Prescriptions as of 06/16/2014  Medication Sig  . ferrous sulfate 325 (65 FE) MG tablet Take 325 mg by mouth daily with breakfast.  . saccharomyces boulardii (FLORASTOR) 250 MG capsule Take 250 mg by mouth 2 (two) times daily.  . Amino Acids-Protein Hydrolys (FEEDING SUPPLEMENT, PRO-STAT SUGAR FREE 64,) LIQD Take 30 mLs by mouth 3 (three) times daily with meals.  . lactulose, encephalopathy, (CHRONULAC) 10  GM/15ML SOLN Take 20 g by mouth 2 (two) times daily.   . magnesium oxide (MAG-OX) 400 MG tablet Take 1 tablet (400 mg total) by mouth daily.  . metroNIDAZOLE (FLAGYL) 500 MG tablet Take 500 mg by mouth 3 (three) times daily.  . Multiple Vitamins-Minerals (CENTRUM SILVER ADULT 50+ PO) Take 1 tablet by mouth daily.  . pantoprazole (PROTONIX) 40 MG tablet Take 40 mg by mouth 2 (two) times daily.  . potassium chloride (K-DUR,KLOR-CON) 10 MEQ tablet Take 2 tablets (20 mEq total) by mouth 2 (two) times daily.  Marland Kitchen torsemide (DEMADEX) 20 MG tablet Take 20 mg by mouth 2 (two) times daily.  . traMADol (ULTRAM) 50 MG tablet Take 50 mg by mouth 2 (two) times daily. AND one tablet by mouth every 6 hours as needed for pain  . vancomycin 1,000 mg in sodium chloride 0.9 % 250 mL Inject 1,000 mg into the vein every 12 (twelve) hours.    DATA REVIEWED  05-14-14: ct of head: atrophy with patchy periventricular small vessel disease. No intracranial mass; hemorrhage; or acute appearing infarct   05-14-14: chest x-ray: central catheter tip in the right atrium near cavo-atrial junction. No pneumothorax. Slight generalized interstitial edema without airspace consolidation. Chronic rotator cuff tears bilaterally   05-15-14: chest x-ray: mild developing interstitial change in the left lower lobe. This could represent atelectasis or developing infiltrate.   05-16-14: TEE: EF  55-60%; normal global left ventricular systolic function. Moderate thickening and calcification of the anterior and posterior mitral valve leaflets. Moderate to severe mitral valve regurgitation. Mild aortic valve sclerosis without stenosis. Mildly elevated pulmonary artery systolic pressure.   05-17-14: abdominal ultrasound: changes suggestive of cirrhosis of the liver with mild ascites.   06/04/14: PCXR: PICC line in place. No signs of infiltrate or congestion   LABS REVIEWED:   05-14-14: glucose 97; bun 23; creat 1.8; k+3.4; na++135; t bili 1.4;  albumin 2.3 lactic acid 4.4 05-15-14: ammonia: 34 05-18-14: glucose 86; bun 9; creat 0.71; k+3.1; na++143 mag 1.6  05-20-14: wbc 9.7; hgb 8.8; hct 26.0; mcv 109; plt 50; glucose 88; bun 7;creat 0.59; k+3.4; na++139 mag 1.8  05-23-14: wbc 11.9; hgb 8.1; hct 23.4; mcv 108; plt 68; glucose 76; bun 15; creat 1.7; k+3.0; na++141; alk phos 192 alt 31 ast 54; t bili 1.2; albumin 1.8; mag 1.4 05-24-14: wbc 10.7; hgb 7.3; hct 22.2; mcv 109.9; plt 65; glucose 70; bun 20; creat 1.4; k+3.3;  na++137; alk phos 144; ast 49; ast 22; t bili 1.3; albumin 1.9; ammonia 43; mag 1.5  06/09/14: Hgb 7.0, Hct 23, WBC 6.2, plt 67, Na 139, k 3.6, CO2 25, Cl 105, glucose 81, BUn 29, Cr 1.5, alb 1.8, AST 55, ALT 19, alp 305, ammonia-59, Fe 27, TIBC 110, ferritin 753, B12 1379, folate 1482 06/14/14: Hgb 9.7, Hct 31.4, MC 109.8, WBC 6.6, plt 71   REVIEW OF SYSTEMS  DATA OBTAINED: from patient, nurse, medical record, poor historian GENERAL: No recent fever. Increased weakness. Decreased appetite. Fatigue noted EYES: NO change in vision, blurry vision, discharge or eye pain RESPIRATORY: No cough, wheezing, SOB, DOE. CARDIAC: No chest pain, palpitations. No edema GI: No abdominal pain  No Nausea,vomiting. NO diarrhea MUSCULOSKELETAL: Increased weakness and decreased ROM to shoulder per patient. NEUROLOGIC:Dizziness when standing. Improved mental status with intermittent confusion. No syncope   PHYSICAL EXAM  97.7 141/87 90 19 99% 2L  GENERAL APPEARANCE: No acute distress, appropriately groomed, frail body habitus Alert, pleasant, conversant. SKIN: No diaphoresis. Thin skin with multiple areas of ecchymosis .  HEAD: Normocephalic, atraumatic EYES: Conjunctiva/lids clear. [place]ER[place] RESPIRATORY: Breathing is even, unlabored . Clear and full BS bilat CARDIOVASCULAR: Heart IRRR   No murmur or extra heart sounds   EDEMA: No peripheral edema   ABD: BS x4, soft non distended GU: foley cath with clear yellow urine. MUSCULOSKELETAL:  overall decreased strength 3/5. Decreased ROM and stiffness to both shoulders PSYCHIATRIC: Mood and affect appropriate to situation. Alert and oriented to self, situation.   ASSESSMENT/PLAN   1)Dehydration: Improved with IVF and blood transfusion. Last BUN/CR 29/1.5. Continue to encourage fluid and hold Demadex for now.  2)C diff: According to the staff he is not having loose stools. Will continue Flagyl til complete for a 5 week course. Add Florastor 250 mg BID. May discontinue precautions per facility policy of stools are formed.  3) Hepatic Encephalopathy: Stable with no change in mental status with Lactulose reduction.  His Ammonia level is trending upward but this does not correlate with his mental state. Will continue to monitor closely but no follow up level needed at this time.   4) Anemia/Thrombocytopenia: Improved after blood transfusion, with a Hgb of 9.7 and plt of 71,000.  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 His iron level is low with an elevated Ferritin. Will add ferrous sulfate $RemoveBeforeD'325mg'[id number]$  daily as he has poor intake.  5) MRSA septicemia: He will complete his Vanc on  06/19/14 which is dosed by pharmacy. His renal function has improved with fluid allowing him to complete this course. Continue to monitor.   6) Malnourishment: He is severely malnourished and losing weight. He is already on Prostat TID. I would not add anything further but hopefully he will begin to eat more and his albumin will increase as he moves out of this acute phase of illness.  He has a catheter in and since his volume status is improving we can attempt to discontinue it. I do not see any urological issues noted in our records or the hospitals. Also he is on oxygen and the staff will attempt to wean this now that his Hgb has increased. He will continue to need close monitoring.  He has made improvement overall but is still frail. He has a hx of aortic valve replacement and DVT but is not able to take coumadin due to low plts. He  will most likely need long term placement. The hospital notes indicate that the wife would be interested in hospice if his condition does not improve over time.   Bard Herbert NP/Christina Wert RN, MSN 06/03/2014

## 2014-06-24 ENCOUNTER — Non-Acute Institutional Stay (SKILLED_NURSING_FACILITY): Payer: Commercial Managed Care - HMO | Admitting: Adult Health

## 2014-06-24 ENCOUNTER — Encounter: Payer: Self-pay | Admitting: Adult Health

## 2014-06-24 DIAGNOSIS — D649 Anemia, unspecified: Secondary | ICD-10-CM

## 2014-06-24 DIAGNOSIS — A4902 Methicillin resistant Staphylococcus aureus infection, unspecified site: Secondary | ICD-10-CM

## 2014-06-24 DIAGNOSIS — K7689 Other specified diseases of liver: Secondary | ICD-10-CM

## 2014-06-24 DIAGNOSIS — R7881 Bacteremia: Secondary | ICD-10-CM

## 2014-06-24 DIAGNOSIS — Z952 Presence of prosthetic heart valve: Secondary | ICD-10-CM

## 2014-06-24 DIAGNOSIS — K7581 Nonalcoholic steatohepatitis (NASH): Secondary | ICD-10-CM

## 2014-06-24 DIAGNOSIS — Z954 Presence of other heart-valve replacement: Secondary | ICD-10-CM

## 2014-06-24 DIAGNOSIS — D696 Thrombocytopenia, unspecified: Secondary | ICD-10-CM

## 2014-06-24 DIAGNOSIS — K219 Gastro-esophageal reflux disease without esophagitis: Secondary | ICD-10-CM

## 2014-06-24 DIAGNOSIS — K7290 Hepatic failure, unspecified without coma: Secondary | ICD-10-CM

## 2014-06-24 DIAGNOSIS — A0472 Enterocolitis due to Clostridium difficile, not specified as recurrent: Secondary | ICD-10-CM

## 2014-06-24 DIAGNOSIS — K7682 Hepatic encephalopathy: Secondary | ICD-10-CM

## 2014-06-24 NOTE — Progress Notes (Signed)
Patient ID: Victor [name], male   DOB: 06-10-1941, 73 y.o.   MRN: [id number]    [facility] (31)  Code Status:  DNR  Chief Complaint  Patient presents with  . Medical Management of Chronic Issues     HPI: This is a 73 y.o. 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 He also was diagnosed with cdiff and hepatic encephalopathy. He has been seen multiple times for dehydration and has received several liters of fluid. He completed a course of Vancomycin.  He was treated for cdiff with flagyl and the staff reports that his stool are one per shift and soft. He denies abd pain, nausea, or vomiting. He also is on lactulose for NASH. He is confused at baseline to the details of his care but can answer questions about his current situation and is pleasant. He has been on oxygen here in the facility due to anemia and the fact that he was dependent on it during his bout of sepsis in the hospital. He was transfused 2 units of PRBCs on 06/11/14 for a Hgb of 7.0 and it increased post transfusion to 9.7.    Since our last visit he seems to be progressing. He is working with therapy and doing some ambulation with walker and assistance. He continues on 1L of oxygen and the staff is working to d/c this. He no longer has a foley and states that he is voiding well. He reports that he is eating and drinking and that the diarrhea is gone. He continues to have mid to low back pain that is chronic per his reports. He uses ultram for this. His PICC line has been removed. The post removal xray indicated some atx vs pneumonia. He denies a runny nose, cough sputum production, or fever.    Allergies  Allergen Reactions  . Zoloft [Sertraline Hcl]    Outpatient Encounter Prescriptions as of 06/24/2014  Medication Sig  . Amino Acids-Protein Hydrolys (FEEDING SUPPLEMENT, PRO-STAT SUGAR FREE 64,) LIQD Take 30 mLs by mouth 3  (three) times daily with meals.  . ferrous sulfate 325 (65 FE) MG tablet Take 325 mg by mouth daily with breakfast.  . lactulose, encephalopathy, (CHRONULAC) 10 GM/15ML SOLN Take 20 g by mouth 2 (two) times daily.   . magnesium oxide (MAG-OX) 400 MG tablet Take 1 tablet (400 mg total) by mouth daily.  . metroNIDAZOLE (FLAGYL) 500 MG tablet Take 500 mg by mouth 3 (three) times daily.  . Multiple Vitamins-Minerals (CENTRUM SILVER ADULT 50+ PO) Take 1 tablet by mouth daily.  . pantoprazole (PROTONIX) 40 MG tablet Take 40 mg by mouth daily.   . potassium chloride (K-DUR,KLOR-CON) 10 MEQ tablet Take 2 tablets (20 mEq total) by mouth 2 (two) times daily.  Marland Kitchen saccharomyces boulardii (FLORASTOR) 250 MG capsule Take 250 mg by mouth 2 (two) times daily.  Marland Kitchen torsemide (DEMADEX) 20 MG tablet Take 20 mg by mouth 2 (two) times daily.  . traMADol (ULTRAM) 50 MG tablet Take 50 mg by mouth 2 (two) times daily. AND one tablet by mouth every 6 hours as needed for pain   Past Medical History  Diagnosis Date  . Enteritis due to Clostridium difficile 05/21/2014  . Encephalopathy, hepatic 05/21/2014  . MRSA bacteremia 05/21/2014  . Hyperlipidemia   . Liver cirrhosis secondary to NASH (nonalcoholic steatohepatitis)   . Anemia   . Thrombocytopenia   . GI bleed   . Hypertension   .  DVT (deep venous thrombosis)   . Septic shock   . Aspiration pneumonia   . Renal insufficiency   . CAD (coronary artery disease)   . Sleep apnea   . Esophageal varices     Past Surgical History  Procedure Laterality Date  . Right knee replacement    . Carpal tunnel release    . Aortic valve replacement    . Back surgery    . Variceal banding       DATA REVIEWED  05-14-14: ct of head: atrophy with patchy periventricular small vessel disease. No intracranial mass; hemorrhage; or acute appearing infarct   05-14-14: chest x-ray: central catheter tip in the right atrium near cavo-atrial junction. No pneumothorax. Slight  generalized interstitial edema without airspace consolidation. Chronic rotator cuff tears bilaterally   05-15-14: chest x-ray: mild developing interstitial change in the left lower lobe. This could represent atelectasis or developing infiltrate.   05-16-14: TEE: EF 55-60%; normal global left ventricular systolic function. Moderate thickening and calcification of the anterior and posterior mitral valve leaflets. Moderate to severe mitral valve regurgitation. Mild aortic valve sclerosis without stenosis. Mildly elevated pulmonary artery systolic pressure.   05-17-14: abdominal ultrasound: changes suggestive of cirrhosis of the liver with mild ascites.   06/04/14: PCXR: PICC line in place. No signs of infiltrate or congestion 06/03/14: PCXR: s/p picc removal, study limited by body habitus, bibasilar atx changes with questionable pneumonia, clinical correlation necessary.  LABS REVIEWED:   05-14-14: glucose 97; bun 23; creat 1.8; k+3.4; na++135; t bili 1.4; albumin 2.3 lactic acid 4.4 05-15-14: ammonia: 34 05-18-14: glucose 86; bun 9; creat 0.71; k+3.1; na++143 mag 1.6  05-20-14: wbc 9.7; hgb 8.8; hct 26.0; mcv 109; plt 50; glucose 88; bun 7;creat 0.59; k+3.4; na++139 mag 1.8  05-23-14: wbc 11.9; hgb 8.1; hct 23.4; mcv 108; plt 68; glucose 76; bun 15; creat 1.7; k+3.0; na++141; alk phos 192 alt 31 ast 54; t bili 1.2; albumin 1.8; mag 1.4 05-24-14: wbc 10.7; hgb 7.3; hct 22.2; mcv 109.9; plt 65; glucose 70; bun 20; creat 1.4; k+3.3;  na++137; alk phos 144; ast 49; ast 22; t bili 1.3; albumin 1.9; ammonia 43; mag 1.5  06/09/14: Hgb 7.0, Hct 23, WBC 6.2, plt 67, Na 139, k 3.6, CO2 25, Cl 105, glucose 81, BUn 29, Cr 1.5, alb 1.8, AST 55, ALT 19, alp 305, ammonia-59, Fe 27, TIBC 110, ferritin 753, B12 1379, folate 1482 06/14/14: Hgb 9.7, Hct 31.4, MC 109.8, WBC 6.6, plt 71   REVIEW OF SYSTEMS  DATA OBTAINED: from patient, nurse, medical record,  GENERAL: Feels well  No recent fever, fatigue, change in activity  status, appetite, decreased weight over time NOSE: Denies drainage or congestion, or nose bleeds RESPIRATORY: No cough, wheezing, SOB CARDIAC: No chest pain, palpitations. No edema GI: No abdominal pain  No Nausea,vomiting,diarrhea or constipation  No heartburn or reflux  MUSCULOSKELETAL: low back pain that is chronic, uses walker for ambulation NEUROLOGIC: No dizziness, fainting, headache, numbness No change in mental status  PSYCHIATRIC: No feelings of anxiety, depression  Sleeps well   No behavior issue   PHYSICAL EXAM   Wt Readings from Last 3 Encounters:  06/03/14 169 lb (76.658 kg)   Temp Readings from Last 3 Encounters:  06/24/14 97.9 F (36.6 C)   06/16/14 97.7 F (36.5 C)   06/11/14 98 F (36.7 C) Oral   BP Readings from Last 3 Encounters:  06/24/14 131/79  06/16/14 141/87  06/11/14 106/58   Pulse Readings  from Last 3 Encounters:  06/24/14 86  06/16/14 90  06/11/14 94    GENERAL APPEARANCE: No acute distress, appropriately groomed, frail body habitus Alert, pleasant, conversant. SKIN: No diaphoresis. Thin skin with multiple areas of ecchymosis .  HEAD: Normocephalic, atraumatic EYES: Conjunctiva/lids clear. PEERLA RESPIRATORY: Breathing is even, unlabored . Clear and full BS bilat CARDIOVASCULAR: Heart RRR   No murmur or extra heart sounds   EDEMA: No peripheral edema   ABD: BS x4, soft non distended GU: no bladder distention or tenderness MUSCULOSKELETAL: overall decreased strength 3/5. Decreased ROM and stiffness to both shoulders PSYCHIATRIC: Mood and affect appropriate to situation. Alert and oriented to self, situation.   ASSESSMENT/PLAN    1)C diff: According to the staff he is not having loose stools. He has completed the course of Flagyl which I believe was 5 week. I would continue the Florastor for now.  3) Hepatic Encephalopathy (NASH): Stable with no change in mental status with Lactulose reduction.  His Ammonia level is trending upward but this  does not correlate with his mental state. Will continue to monitor closely, check LFTS in 3 weeks. There are no signs of ascites. He was previously on Demadex but this is on hold due to his recurrent dehydration. We will monitor his fluid status and restart if indicated.   4) Anemia/Thrombocytopenia: Improved after blood transfusion, with a Hgb of 9.7 and plt of 71,000.  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 He was started on Iron at his last visit and is tolerating this well. Will recheck a CBC in 3 weeks. There are no active signs of bleeding.  5) MRSA septicemia: He has completed a course of Vanc and his PICC has been removed.   6) Malnourishment: He is severely malnourished and losing weight. He is already on Prostat TID. Will recheck albumin with CMP in 3 weeks  7) Aortic valve replacement: He has a hx of this, as well as DVT. He is not a candidate for anticoagulation. He is a DNR. There are no signs of complications regarding this issue at this time, but he is certainly at high risk.  8) Hypomagnesia: He is on Mag Ox, will check mag level with next draw  9) GERD: Continue Protonix but reduce dose to once daily.  10) Atelectasis: There are no clinical signs of pneumonia at this time. Will begin aggressive pulm toilet with an IS q2 hrs WA and continue to try to wean oxygen  Bard Herbert NP/[employer] executive, MSN 06/03/2014

## 2014-07-05 MED ORDER — PANTOPRAZOLE SODIUM 40 MG PO TBEC: 40.0000 mg | DELAYED_RELEASE_TABLET | Freq: Every day | ORAL | Status: AC

## 2014-07-27 DEATH — deceased

## 2015-03-18 NOTE — Consult Note (Signed)
Pt had EGD showing grade 1-2 varices with no signs of bleeding.  Mild portal hypertensive gastropathy.  Due to low plt ct this morning he was given a unit of platelets before the procedure.  Will advance to full liquid diet.  He may be tapered off the octreotide over next 24-48 hours.  Would start low dose Nadolol 20 mg if you agree.  PPI bid iv and transition to oral.  Consider carafate slurrry.  Dr,. Oh to cover this weekend.  Electronic Signatures: Scot JunElliott, Robert T (MD)  (Signed on 07-Mar-14 15:15)  Authored  Last Updated: 07-Mar-14 15:15 by Scot JunElliott, Robert T (MD)

## 2015-03-18 NOTE — Consult Note (Signed)
His BP was low and nadolol was stopped.  Suggest give one dose every 24 hours and give it around supper time.  He has no new complaints, no bleeding.  Hgb stable since transfusion.  Plt 60K.  [facility].  No new suggestions.  Home soon.  Electronic Signatures: Scot JunElliott, Robert T (MD)  (Signed on 10-Mar-14 17:35)  Authored  Last Updated: 10-Mar-14 17:35 by Scot JunElliott, Robert T (MD)

## 2015-03-18 NOTE — Consult Note (Signed)
PATIENT NAME:  Victor [name], Victor [name] MR#:  [id number] DATE OF BIRTH:  1940/12/09  DATE OF CONSULTATION:  01/29/2013  REFERRING PHYSICIAN:  Dr. Clerance Lav Vasireddy  CONSULTING PHYSICIAN:  Lynnae Prude, MD/Karyn M. Colin Benton, PA-C PRIMARY CARE PHYSICIAN: Einar Crow, MD   REASON FOR CONSULT AND CHIEF COMPLAINT: Hematemesis in a patient with a known history of esophageal varices.   HISTORY OF PRESENT ILLNESS:  This is a 74 year old gentleman seen in consultation at the request of Dr. Heron Nay for evaluation of hematemesis.  He has a known past medical history significant for nonalcoholic liver cirrhosis that is complicated by esophageal varices.  He states that in the middle of the night last night he began vomiting blood through approximately 5 a.m. this morning with accompanying black stool this morning after waking up.  He does report feeling increased tiredness and fatigue over the past 24 to 48 hours as well.  In fact, he was standing up from a seated position while at home and did start to fall backwards, according to his wife.  He is well established with Dr. Barnetta Chapel, who is managing his cirrhosis.  Upon admission, he was found to have a decreased hemoglobin to 7.5 and platelets of 71.  He was quite hypotensive; and, therefore, he was admitted for further management and evaluation.  He was started on a Protonix drip as well as an octreotide drip and has been scheduled to check his hemoglobin every 6 hours.  He is currently being transfused packed red blood cells when I am speaking with him. Currently, he states it has been since he first arrived to the ER that he has vomited.  He is already starting to feel as though his energy level is returning since being transferred to the CCU.  Prior to the past 24 to 48 hours, the patient denies noticing any blood in his stool or having recent symptoms of nausea or vomiting.  There is no fever or chills.  There is no chest pain or shortness of breath. The  patient's wife is at bedside, who does confirm that the patient does not drink alcohol.  Of note, she does also mention that he has had an approximately 50 to 60-pound weight loss over the past 1-1/2 years which she attributes to a decrease in appetite and eating smaller portions.   ALLERGIES:  No known drug allergies.  PAST MEDICAL HISTORY:  1. Nonalcoholic liver cirrhosis complicated by esophageal varices.  2. Portal hypertension.  3. Debility. 4. Aortic valve replacement.  PAST SURGICAL HISTORY:   1. Aortic valve replacement during which time he did have a variceal bleed during the procedure.  2. Right knee replacement.  3. Back surgery.   HOME MEDICATIONS:  A multivitamin daily, Klor-Con 10 mEq daily, Fosamax 70 mg once daily, Colon Health 1 tablet daily, calcium 60 mg twice daily.   SOCIAL HISTORY: He does have a remote history of tobacco use, but he quit approximately 25 years ago.  He denies any current alcohol, tobacco or illicit drug use.  His wife is present at bedside, who is his primary caregiver.   FAMILY HISTORY: There is no known family history of GI malignancy or IBD; however, his mother did have breast cancer, unknown age of diagnosis.   REVIEW OF SYSTEMS: A 12-system review of systems was obtained on the patient.  All pertinent positives are mentioned above and are otherwise negative.  PHYSICAL EXAMINATION:  VITAL SIGNS: Blood pressure 97/34, heart rate 98, respirations 14, temperature 98.8, bedside  pulse ox 96%.  GENERAL: This is a pleasant 74 year old gentleman accompanied by his wife at bedside in no acute distress, alert and oriented x 3.  He does appear to be somnolent and tired, however, he is easily arousable.   NECK: Supple, no lymphadenopathy noted.  HEENT:  Sclerae anicteric.  Mucous membranes are moist.  It does appear that he has some dried blood on the outside of his teeth and gums.  PULMONARY:  Respirations are even and unlabored, clear to auscultation in  bilateral anterior lung fields.  CARDIAC: Regular rate and rhythm, S1 and S2 noted.  ABDOMEN: Soft, nontender, nondistended.  Normoactive bowel sounds noted in all 4 quadrants. No masses palpated. No guarding or rebound. No hernia is appreciated.  EXTREMITIES:  Negative for lower extremity edema, 2+ pulses noted bilateral upper extremities.  RECTAL: Exam deferred.  NEUROLOGICAL: Alert and oriented x3.  He is tired but arousable, appropriate mood and affect.   LABORATORY DATA: White blood cells 5, hemoglobin 7.5, hematocrit 21.6, platelets 71.  Sodium 143, potassium 4.2, BUN 39, creatinine 0.83, glucose 106, MCV 104, lipase 91, bilirubin 0.6, alkaline phosphatase 115, ALT 25, AST 48, albumin 1.8, PT 16.5, INR 1.3.    ASSESSMENT:   1. Upper gastrointestinal bleed, suspected related to known esophageal varices.  2. Anemia, likely multifactorial secondary to acute gastrointestinal blood loss on top of anemia of chronic disease. 3. Thrombocytopenia: Secondary to his known history of cirrhosis.  4. Nonalcoholic liver cirrhosis.  5. Known history of esophageal varices.  6. Hypotension, improving since being admitted, likely secondary to acute blood loss.   PLAN: I have discussed this patient's case in detail with Dr. Lynnae Prude, who is involved in the development of the patient's plan of care.  At this time, the overall clinical picture is suggestive of an esophageal variceal bleed.  Therefore, we do agree with the patient being admitted to the CCU and placed on a Protonix and octreotide drip.  We do agree with the patient's H and H being check q. 6 hours and being prepared to transfuse as necessary.  Currently the patient is n.p.o. status, and his nausea and vomiting seems to have resolved with antiemetics.  Therefore, we do feel he can benefit from an EGD to evaluate the area.  The alternatives, risks and benefits of this procedure were discussed in detail with the patient and the patient's wife, who  verbalized understanding and agreement.  We can proceed with this procedure either later on this afternoon or first thing tomorrow morning with Dr. Lynnae Prude. Continue to keep a close eye on his H and H, his vital signs, and continue symptomatic control with antiemetics and p.r.n. medications as needed.  Based on today's labs, his calculated MELD score for his cirrhosis is a 9.  Due to a history of recurrent esophageal variceal bleeding, we did take note that he is not on a beta blocker at home, and we can certainly consider this as therapy upon discharge, which we will discuss with the patient. Continue other plans as outlined by Internal Medicine and other consulting services.  We will continue to follow this patient throughout hospitalization and make further recommendations pending above and per clinical course.    Thank you so much for this consultation and allowing [place] to participate in the patient's plan of care.       The above plan of care was discussed and agreed upon under a supervisory agreement between myself and Dr. Lynnae Prude. ____________________________ Stark Klein  Sharia ReeveM. Earle, PA-C kme:cb D: 01/29/2013 14:34:25 ET T: 01/29/2013 15:14:53 ET JOB#: [id number]  cc: Hardie ShackletonKaryn M. Earle, PA-C, <Dictator> Hardie ShackletonKARYN M EARLE PA ELECTRONICALLY SIGNED 02/02/2013 12:05

## 2015-03-18 NOTE — Consult Note (Signed)
Chief Complaint:  Subjective/Chief Complaint Covering for Dr. Vira Agar. Drop in hgb. Therefore, getting blood transfusions. No active bleeding since admission. Pt with esophageal varices and portal gastropathy on EGD but no active bleeding.   VITAL SIGNS/ANCILLARY NOTES: **Vital Signs.:   08-Mar-14 06:00  Pulse Pulse 84  Respirations Respirations 22  Systolic BP Systolic BP 800  Diastolic BP (mmHg) Diastolic BP (mmHg) 50  Mean BP 72  Pulse Ox % Pulse Ox % 97  Pulse Ox Heart Rate 84   Brief Assessment:  Cardiac Regular   Respiratory clear BS   Gastrointestinal Normal   Additional Physical Exam Mild peripheral edema   Lab Results:  Routine Chem:  08-Mar-14 03:51   Glucose, Serum  101  BUN  28  Creatinine (comp) 0.97  Sodium, Serum  146  Potassium, Serum  3.2  Chloride, Serum  116  CO2, Serum 23  Calcium (Total), Serum  7.2  Anion Gap 7  Osmolality (calc) 296  eGFR (African American) >60  eGFR (Non-African American) >60 (eGFR values <35m/min/1.73 m2 may be an indication of chronic kidney disease (CKD). Calculated eGFR is useful in patients with stable renal function. The eGFR calculation will not be reliable in acutely ill patients when serum creatinine is changing rapidly. It is not useful in  patients on dialysis. The eGFR calculation may not be applicable to patients at the low and high extremes of body sizes, pregnant women, and vegetarians.)  Routine Hem:  08-Mar-14 03:51   WBC (CBC) 5.4  RBC (CBC)  1.92  Hemoglobin (CBC)  6.4  Hematocrit (CBC)  18.9  Platelet Count (CBC)  47  MCV 98  MCH 33.1  MCHC 33.7  RDW  18.8  Neutrophil % 77.5  Lymphocyte % 12.7  Monocyte % 6.4  Eosinophil % 3.2  Basophil % 0.2  Neutrophil # 4.2  Lymphocyte #  0.7  Monocyte # 0.3  Eosinophil # 0.2  Basophil # 0.0 (Result(s) reported on 31 Jan 2013 at 04:32AM.)   Assessment/Plan:  Assessment/Plan:  Assessment Likely UGI bleeding recently but no active bleeding right now.  On nadolol 232mdaily with HR 70's to 80's and SBP in 110 range.   Plan Agree with blood transfusions and monitering hgb and pltc. Would try to increase nadolol to 4050maily if BP/HR can tolerate it. Can switch to protonix bid. Stay on liquid diet as long as hgb keeps dropping. Thanks.   Electronic Signatures: Oh,Verdie ShireD)  (Signed 08-Mar-14 09:35)  Authored: Chief Complaint, VITAL SIGNS/ANCILLARY NOTES, Brief Assessment, Lab Results, Assessment/Plan   Last Updated: 08-Mar-14 09:35 by Oh,Verdie ShireD)

## 2015-03-18 NOTE — Consult Note (Signed)
Pt with hematemesis and melena.  His vomiting was dark blood, no BRB in vomitus.  This may indicate gastric bleed instead of variceal.  Plan EGD tomorrow morning. See NP/PA note.    Electronic Signatures: Scot JunElliott, Robert T (MD)  (Signed on 06-Mar-14 18:04)  Authored  Last Updated: 06-Mar-14 18:04 by Scot JunElliott, Robert T (MD)

## 2015-03-18 NOTE — H&P (Signed)
PATIENT NAME:  Victor Victor [name], Victor Victor [name]#:  [id number] DATE OF BIRTH:  11-07-1941  DATE OF ADMISSION:  01/29/2013  PRIMARY CARE PHYSICIAN:  Einar Crow, MD  CHIEF COMPLAINT: Vomiting blood.  HISTORY OF PRESENT ILLNESS:  Victor [name]. 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  The patient usually sleeps in a recliner, stood up and fell backwards.  Had 1 episode of vomiting which was coffee-ground emesis.  The patient's wife helped him to go to the bathroom, had 2 to 3 episodes of maroon stools.  Had another 2 episodes of coffee-ground emesis.  Concerning this, EMS was called and was brought to the Emergency Department.  On workup, initial, the patient's blood pressure was in the 70s.  The patient received 2 liters of IV fluids with improvement of systolic blood pressure to 109.  The patient has been having frequent falls in the last 1 month.  After receiving Phenergan in the Emergency Department, started to have slow slurred speech.  Denies having any weakness in any part of the body.  The patient is somewhat somnolent. The history is mainly obtained from the patient's wife who is at bedside.  The patient received IV Protonix and octreotide in the Emergency Department.  Denies having any abdominal pain.    PAST MEDICAL HISTORY:  1.  Cirrhosis. 2.  Previous history of GI bleed. 3.  Esophageal varices. 4.  Portal hypertension. 5.  Debility. 6.  Aortic valve replacement.  ALLERGIES:  No known drug allergies.  HOME MEDICATIONS:  1.  Multivitamin 1 tablet daily. 2.  Klor-Con 10 milliequivalents daily. 3.  Imuran 50 mg 3-1/2 tablets daily. 4.  Fosamax 70 mg 1 tablet once a day. 5.  Colon Health 1 tablet daily. 6.  Calcium 600 mg 2 tablets a day.  PAST SURGICAL HISTORY:  1.  Aortic valve replacement. 2.  Right knee replacement. 3.  Back  surgery.  SOCIAL HISTORY: Smoked in the past. Quit about 25 years back.  Denies drinking alcohol, using illicit drugs.  Married. Lives with his wife.  The patient weighed in the past about 270 pounds, currently weighing 160 pounds.    FAMILY HISTORY:  History of diabetes mellitus.   REVIEW OF SYSTEMS: CONSTITUTIONAL:  Generalized weakness, fatigue. EYES:  No change in vision. ENT:  No sore throat, nosebleeds or rhinorrhea. RESPIRATORY:  Has shortness of breath with exertion. No cough. CARDIOVASCULAR:  No chest pain, palpitations.  No pedal edema. GASTROENTEROLOGY:  Having nausea, hematemesis.  hematochezia.  GENITOURINARY:  Denies any dysuria. SKIN:  Denies having any rash, lesions. NEUROLOGIC:  Denies having any weakness in any part of the body.  PHYSICAL EXAMINATION: GENERAL:  This is a thin-built male, looks much older than his stated age, lying down in the bed, not in distress.  VITALS:  Temperature 97.7, pulse 76, blood pressure 109/56, respiratory rate 16, and oxygen saturation 94% on room air. HEENT:  Head normocephalic, atraumatic.  No scleral icterus.  Conjunctivae are normal. Pupils equal and reactive to light.  Mucous membranes moist.   NECK:  Supple. No lymphadenopathy.  No JVD.  No carotid bruit. CHEST:  No focal tenderness.  Lungs were clear to auscultation.  HEART:  S1 and S2 regular. No murmurs are heard. ABDOMEN:  Bowel sounds present.  Mild tenderness in the epigastric area.  EXTREMITIES: 2+ pitting edema.  NEUROLOGIC:  The patient is  somewhat somnolent, however, oriented to place, person and time.  No apparent cranial nerve abnormalities. No motor or sensory deficits.   LABS:  CMP is completely within normal limits.  CBC: WBC 5, hemoglobin 7.5, platelet count 71.   ASSESSMENT AND PLAN: Victor [name]. Victor Victor [name] is a 74 year old with known history of cirrhosis and esophageal varices, followed by Dr. Marva PandaSkulskie, comes to the Emergency Department with hematemesis. 1.  Hematemesis, most  likely secondary to esophageal varices.  Admit the patient to intensive care unit, keep the patient NPO, continue with intravenous fluids, transfuse 2 units of packed red blood cells, start the patient on Protonix and octreotide drip.  SBP prophylaxis with Rocephin. Will continue to follow up complete blood count every 6 hours.   2.  Cirrhosis with esophageal varices.  The patient is not on any beta blockers which might help with portal hypertension.   3.  Anemia secondary to blood loss.  Continue to transfuse, 2 units of packed red blood cell, and continue to follow up complete blood count. 4.  Hypovolemic shock secondary to blood loss, improving with intravenous fluids.  5.  Keep the patient on SCDs.   Considering the patient's illness, I discussed regarding this with the patient's wife, as well as the patient, and reviewed all records.  TIME SPENT:  70 minutes.  ____________________________ Susa GriffinsPadmaja Vasireddy, MD pv:sb D: 01/29/2013 07:46:01 ET T: 01/29/2013 08:00:54 ET JOB#: [id number]  cc: Susa GriffinsPadmaja Vasireddy, MD, <Dictator> Susa GriffinsPADMAJA VASIREDDY MD ELECTRONICALLY SIGNED 01/30/2013 2:41

## 2015-03-18 NOTE — Consult Note (Signed)
Chief Complaint:  Subjective/Chief Complaint No further bleeding. Hgb up after transfusions. HR in 60's and SBP in 90's to 100's   VITAL SIGNS/ANCILLARY NOTES: **Vital Signs.:   09-Mar-14 07:40  Vital Signs Type Routine  Pulse Pulse 64  Pulse source if not from Vital Sign Device per cardiac monitor  Respirations Respirations 22  Systolic BP Systolic BP 91  Diastolic BP (mmHg) Diastolic BP (mmHg) 46  Mean BP 61  BP Source  if not from Vital Sign Device non-invasive  Pulse Ox % Pulse Ox % 96  Pulse Ox Activity Level  At rest  Oxygen Delivery 2L  Pulse Ox Heart Rate 64   Brief Assessment:  Cardiac Regular   Respiratory clear BS   Gastrointestinal Normal   Lab Results: Routine Chem:  09-Mar-14 06:39   Glucose, Serum 88  BUN  21  Creatinine (comp) 0.76  Sodium, Serum 142  Potassium, Serum 3.9  Chloride, Serum  111  CO2, Serum 22  Calcium (Total), Serum  7.6  Anion Gap 9  Osmolality (calc) 286  eGFR (African American) >60  eGFR (Non-African American) >60 (eGFR values <21m/min/1.73 m2 may be an indication of chronic kidney disease (CKD). Calculated eGFR is useful in patients with stable renal function. The eGFR calculation will not be reliable in acutely ill patients when serum creatinine is changing rapidly. It is not useful in  patients on dialysis. The eGFR calculation may not be applicable to patients at the low and high extremes of body sizes, pregnant women, and vegetarians.)  Routine Hem:  09-Mar-14 06:39   WBC (CBC) 6.4  RBC (CBC)  2.74  Hemoglobin (CBC)  9.1  Hematocrit (CBC)  26.7  Platelet Count (CBC)  49  MCV 98  MCH 33.3  MCHC 34.1  RDW  19.1  Neutrophil % 76.4  Lymphocyte % 11.2  Monocyte % 7.5  Eosinophil % 4.6  Basophil % 0.3  Neutrophil # 4.9  Lymphocyte #  0.7  Monocyte # 0.5  Eosinophil # 0.3  Basophil # 0.0 (Result(s) reported on 01 Feb 2013 at 07:47AM.)   Assessment/Plan:  Assessment/Plan:  Assessment UGI bleeding. No active  bleeding.   Plan ok for transfer to floor. Switch to po protonix bid. Keep at nadolol at 273mdaily. Can advance diet as tolerated. Dr. ElVira Agaro see patient tomorrow. Thanks   Electronic Signatures: OhVerdie ShireMD)  (Signed 09-Mar-14 08:28)  Authored: Chief Complaint, VITAL SIGNS/ANCILLARY NOTES, Brief Assessment, Lab Results, Assessment/Plan   Last Updated: 09-Mar-14 08:28 by OhVerdie ShireMD)

## 2015-03-18 NOTE — Discharge Summary (Signed)
Dates of Admission and Diagnosis:  Date of Admission 29-Jan-2013   Date of Discharge 03-Feb-2013   Admitting Diagnosis gi bleed   Final Diagnosis anemia from gi bleed   Discharge Diagnosis 1 hypovolemic shock   2 cirrhosis   3 hypotension from meds    Chief Complaint/History of Present Illness see h and p     Hepatic:  06-Mar-14 06:06   Bilirubin, Total 0.6  Alkaline Phosphatase 115  SGPT (ALT) 25  SGOT (AST)  48  Total Protein, Serum  5.7  Albumin, Serum  1.8  Routine BB:  06-Mar-14 06:06   Crossmatch Unit 1 Transfused  Crossmatch Unit 1 Cancelled  Crossmatch Unit 1 Transfused  Crossmatch Unit 2 Transfused  Result(s) reported on 01 Feb 2013 at 06:44AM.  Crossmatch Unit 2 Cancelled  Crossmatch Unit 2 Transfused  Result(s) reported on 30 Jan 2013 at 12:36AM.  ABO Group + Rh Type A Positive  Antibody Screen NEGATIVE (Result(s) reported on 29 Jan 2013 at 07:24AM.)  07-Mar-14 07:54   Platelets (Blood Component) Transfused  Result(s) reported on 31 Jan 2013 at 06:17AM.  Cardiology:  06-Mar-14 06:07   Ventricular Rate 62  Atrial Rate 62  P-R Interval 206  QRS Duration 86  QT 468  QTc 475  P Axis 69  R Axis 33  T Axis 54  ECG interpretation Normal sinus rhythm Normal ECG When compared with ECG of 10-Mar-2010 10:35, No significant change was found ----------unconfirmed---------- Confirmed by OVERREAD, NOT ([id number]), editor PEARSON, BARBARA ([id number]) on 01/30/2013 8:59:40 AM  Routine Chem:  06-Mar-14 06:06   Glucose, Serum  106  BUN  39  Creatinine (comp) 0.83  Sodium, Serum 143  Potassium, Serum 4.2  Chloride, Serum  110  CO2, Serum 28  Calcium (Total), Serum  7.9  Anion Gap  5  Osmolality (calc) 295  eGFR (African American) >60  eGFR (Non-African American) >60 (eGFR values <67m/min/1.73 m2 may be an indication of chronic kidney disease (CKD). Calculated eGFR is useful in patients with stable renal function. The eGFR calculation will not be reliable in  acutely ill patients when serum creatinine is changing rapidly. It is not useful in  patients on dialysis. The eGFR calculation may not be applicable to patients at the low and high extremes of body sizes, pregnant women, and vegetarians.)  Result Comment XMATCH - SPECIMEN EXPIRED  Result(s) reported on 02 Feb 2013 at 09:46AM.  Lipase 91 (Result(s) reported on 29 Jan 2013 at 06:25AM.)  07-Mar-14 09:37   Result Comment - specimen clotted.reordered cbc.  Result(s) reported on 30 Jan 2013 at 09:58AM.  08-Mar-14 03:51   Glucose, Serum  101  BUN  28  Creatinine (comp) 0.97  Sodium, Serum  146  Potassium, Serum  3.2  Chloride, Serum  116  CO2, Serum 23  Calcium (Total), Serum  7.2  Anion Gap 7  Osmolality (calc) 296  eGFR (African American) >60  eGFR (Non-African American) >60 (eGFR values <622mmin/1.73 m2 may be an indication of chronic kidney disease (CKD). Calculated eGFR is useful in patients with stable renal function. The eGFR calculation will not be reliable in acutely ill patients when serum creatinine is changing rapidly. It is not useful in  patients on dialysis. The eGFR calculation may not be applicable to patients at the low and high extremes of body sizes, pregnant women, and vegetarians.)  09-Mar-14 06:39   Glucose, Serum 88  BUN  21  Creatinine (comp) 0.76  Sodium, Serum 142  Potassium, Serum 3.9  Chloride, Serum  111  CO2, Serum 22  Calcium (Total), Serum  7.6  Anion Gap 9  Osmolality (calc) 286  eGFR (African American) >60  eGFR (Non-African American) >60 (eGFR values <59m/min/1.73 m2 may be an indication of chronic kidney disease (CKD). Calculated eGFR is useful in patients with stable renal function. The eGFR calculation will not be reliable in acutely ill patients when serum creatinine is changing rapidly. It is not useful in  patients on dialysis. The eGFR calculation may not be applicable to patients at the low and high extremes of body sizes,  pregnant women, and vegetarians.)  10-Mar-14 04:37   Glucose, Serum 82  BUN  20  Creatinine (comp) 0.76  Sodium, Serum 139  Potassium, Serum 3.6  Chloride, Serum  108  CO2, Serum 26  Calcium (Total), Serum  7.6  Anion Gap  5  Osmolality (calc) 279  eGFR (African American) >60  eGFR (Non-African American) >60 (eGFR values <630mmin/1.73 m2 may be an indication of chronic kidney disease (CKD). Calculated eGFR is useful in patients with stable renal function. The eGFR calculation will not be reliable in acutely ill patients when serum creatinine is changing rapidly. It is not useful in  patients on dialysis. The eGFR calculation may not be applicable to patients at the low and high extremes of body sizes, pregnant women, and vegetarians.)  Routine UA:  07-Mar-14 05:41   Color (UA) Yellow  Clarity (UA) Clear  Glucose (UA) Negative  Bilirubin (UA) Negative  Ketones (UA) Negative  Specific Gravity (UA) 1.019  Blood (UA) Negative  pH (UA) 5.0  Protein (UA) Negative  Nitrite (UA) Negative  Leukocyte Esterase (UA) Negative (Result(s) reported on 30 Jan 2013 at 06:10AM.)  RBC (UA) 1 /HPF  WBC (UA) <1 /HPF  Bacteria (UA) NONE SEEN  Epithelial Cells (UA) NONE SEEN  Result(s) reported on 30 Jan 2013 at 06:10AM.  Routine Coag:  06-Mar-14 06:06   Prothrombin  16.5  INR 1.3 (INR reference interval applies to patients on anticoagulant therapy. A single INR therapeutic range for coumarins is not optimal for all indications; however, the suggested range for most indications is 2.0 - 3.0. Exceptions to the INR Reference Range may include: Prosthetic heart valves, acute myocardial infarction, prevention of myocardial infarction, and combinations of aspirin and anticoagulant. The need for a higher or lower target INR must be assessed individually. Reference: The Pharmacology and Management of the Vitamin K  antagonists: the seventh ACCP Conference on Antithrombotic and Thrombolytic  Therapy. [id number].[id number]:126 (3suppl): [id number] HCT value >55% may artifactually increase the PT.  In one study,  the increase was an average of 25%. Reference:  "Effect on Routine and Special Coagulation Testing Values of Citrate Anticoagulant Adjustment in Patients with High HCT Values." American Journal of Clinical Pathology 2006;126:400-405.)  Routine Hem:  06-Mar-14 06:06   WBC (CBC) 5.0  RBC (CBC)  2.09  Hemoglobin (CBC)  7.5  Hematocrit (CBC)  21.6  Platelet Count (CBC)  71  MCV  104  MCH  35.9  MCHC 34.7  RDW  14.7  Neutrophil % 65.7  Lymphocyte % 20.7  Monocyte % 7.3  Eosinophil % 5.2  Basophil % 1.1  Neutrophil # 3.3  Lymphocyte # 1.0  Monocyte # 0.4  Eosinophil # 0.3  Basophil # 0.1 (Result(s) reported on 29 Jan 2013 at 06:25AM.)    14:12   WBC (CBC) 9.1  RBC (CBC)  2.46  Hemoglobin (CBC)  8.1  Hematocrit (CBC)  23.9  Platelet Count (CBC)  47  MCV 97  MCH 33.1  MCHC 34.0  RDW  18.4  Neutrophil % 88.1  Lymphocyte % 5.7  Monocyte % 5.6  Eosinophil % 0.3  Basophil % 0.3  Neutrophil #  8.0  Lymphocyte #  0.5  Monocyte # 0.5  Eosinophil # 0.0  Basophil # 0.0 (Result(s) reported on 29 Jan 2013 at 03:38PM.)    22:11   WBC (CBC)  15.1  RBC (CBC)  2.63  Hemoglobin (CBC)  8.6  Hematocrit (CBC)  25.9  Platelet Count (CBC)  57  MCV 98  MCH 32.8  MCHC 33.4  RDW  18.6  Neutrophil % 83.9  Lymphocyte % 10.4  Monocyte % 4.4  Eosinophil % 0.2  Basophil % 1.1  Neutrophil #  12.6  Lymphocyte # 1.6  Monocyte # 0.7  Eosinophil # 0.0  Basophil #  0.2 (Result(s) reported on 29 Jan 2013 at 10:29PM.)  07-Mar-14 04:38   WBC (CBC) 8.4  RBC (CBC)  2.26  Hemoglobin (CBC)  7.6  Hematocrit (CBC)  22.1  Platelet Count (CBC)  44  MCV 98  MCH 33.7  MCHC 34.5  RDW  18.7  Neutrophil % 80.2  Lymphocyte % 13.6  Monocyte % 5.2  Eosinophil % 0.8  Basophil % 0.2  Neutrophil #  6.8  Lymphocyte # 1.1  Monocyte # 0.4  Eosinophil # 0.1  Basophil # 0.0 (Result(s)  reported on 30 Jan 2013 at 05:36AM.)    09:37   WBC (CBC) -  RBC (CBC) -  Hemoglobin (CBC) -  Hematocrit (CBC) -  Platelet Count (CBC) -  MCV -  MCH -  MCHC -  RDW -  Neutrophil % -  Lymphocyte % -  Monocyte % -  Eosinophil % -  Basophil % -  Neutrophil # -  Lymphocyte # -  Monocyte # -  Eosinophil # -  Basophil # -  Bands -  Segmented Neutrophils -  Lymphocytes -  Variant Lymphocytes -  Monocytes -  Eosinophil -  Basophil -  Metamyelocyte -  Myelocyte -  Promyelocyte -  Blast-Like -  Other Cells -  NRBC -  Diff Comment 1 -  Diff Comment 2 -  Diff Comment 3 -  Diff Comment 4 -  Diff Comment 5 -  Diff Comment 6 -  Diff Comment 7 -  Diff Comment 8 -  Diff Comment 9 -  Diff Comment 10 - (Result(s) reported on 30 Jan 2013 at 09:58AM.)    10:32   WBC (CBC) 7.1  RBC (CBC)  2.17  Hemoglobin (CBC)  7.2  Hematocrit (CBC)  21.3  Platelet Count (CBC)  42  MCV 98  MCH 33.2  MCHC 33.7  RDW  18.8  Neutrophil % 83.6  Lymphocyte % 9.9  Monocyte % 4.9  Eosinophil % 1.3  Basophil % 0.3  Neutrophil # 5.9  Lymphocyte #  0.7  Monocyte # 0.3  Eosinophil # 0.1  Basophil # 0.0 (Result(s) reported on 30 Jan 2013 at 10:50AM.)  08-Mar-14 03:51   WBC (CBC) 5.4  RBC (CBC)  1.92  Hemoglobin (CBC)  6.4  Hematocrit (CBC)  18.9  Platelet Count (CBC)  47  MCV 98  MCH 33.1  MCHC 33.7  RDW  18.8  Neutrophil % 77.5  Lymphocyte % 12.7  Monocyte % 6.4  Eosinophil % 3.2  Basophil % 0.2  Neutrophil # 4.2  Lymphocyte #  0.7  Monocyte # 0.3  Eosinophil # 0.2  Basophil # 0.0 (Result(s) reported on 31 Jan 2013 at 04:32AM.)  09-Mar-14 06:39   WBC (CBC) 6.4  RBC (CBC)  2.74  Hemoglobin (CBC)  9.1  Hematocrit (CBC)  26.7  Platelet Count (CBC)  49  MCV 98  MCH 33.3  MCHC 34.1  RDW  19.1  Neutrophil % 76.4  Lymphocyte % 11.2  Monocyte % 7.5  Eosinophil % 4.6  Basophil % 0.3  Neutrophil # 4.9  Lymphocyte #  0.7  Monocyte # 0.5  Eosinophil # 0.3  Basophil # 0.0  (Result(s) reported on 01 Feb 2013 at 07:47AM.)  10-Mar-14 04:37   WBC (CBC) 6.9  RBC (CBC)  2.88  Hemoglobin (CBC)  9.7  Hematocrit (CBC)  27.6  Platelet Count (CBC)  60  MCV 96  MCH 33.7  MCHC 35.1  RDW  18.4  Neutrophil % 68.2  Lymphocyte % 14.1  Monocyte % 10.8  Eosinophil % 6.4  Basophil % 0.5  Neutrophil # 4.7  Lymphocyte # 1.0  Monocyte # 0.8  Eosinophil # 0.4  Basophil # 0.0 (Result(s) reported on 02 Feb 2013 at 05:40AM.)   PERTINENT RADIOLOGY STUDIES: XRay:    06-Mar-14 07:11, Chest Portable Single View  Chest Portable Single View   REASON FOR EXAM:    hemetemesis  COMMENTS:       PROCEDURE: DXR - DXR PORTABLE CHEST SINGLE VIEW  - Jan 29 2013  7:11AM     RESULT: Cardiac monitoring electrodes present. The heart appears normal.   There is patchy density at the right lung base concerning for infiltrate   versus atelectasis. Interstitial markings are prominent. Chronic   degenerative changes are seen in the right shoulder and left shoulder.    IMPRESSION:  Right lung base atelectasis versus infiltrate.    Dictation Site: 2      Verified By: Sundra Aland, M.D., MD  MRI:    17-Feb-14 09:39, MRI Lumbar Spine Without Contrast  MRI Lumbar Spine Without Contrast   REASON FOR EXAM:    Back Pain Leg Weakness Eval Stenosis New Fx  COMMENTS:       PROCEDURE: MR  - MR LUMBAR SPINE WO CONTRAST  - Jan 12 2013  9:39AM     RESULT: MRI LUMBAR SPINE WITHOUT CONTRAST    HISTORY: Leg weakness, stenosis    COMPARISON: 10/06/2010    TECHNIQUE: Multiplanar and multisequence MRI of the lumbar spine were   obtained, without administration of IV contrast.     FINDINGS:   There is an subacute versus chronic L3 vertebral body compression   fracture with approximately 30% height loss. There is mild marrow edema   within the anterior-inferior aspect of the L2 vertebral body which may   reflect a mild compression fracture versus Modic endplate change versus   nonspecific  marrow edema. There is fluid signal within the L2-L3 disc   space which may be secondary to injury versus discitis. There is an old   compression fracture of the T12 vertebral body. There is otherwise normal   bone marrow signal demonstrated throughout the vertebra. There is   degenerative disc disease throughout the lumbar spine.     The spinal cord is of normal volume and contour. The cord terminates   normally at L1 . The nerve roots of the cauda equina and the filum   terminale have the usual appearance.     There is incompletely visualized likelyold right sacral insufficiency  fracture.    The imaged intra-abdominal contents are unremarkable.     T12-L1: Minimal broad-based disc bulge. No evidence of neural foraminal   or central stenosis.     L1-L2: Mild broad-based disc bulge. No evidence of neural foraminal or   central stenosis.     L2-L3: Moderate broad-based disc bulge with a right far lateral disc   component. Mild bilateral facet arthropathy. Mild spinal stenosis. No   significant foraminal stenosis.    L3-L4: Moderate broad-based disc bulge. Moderate bilateral facet     arthropathy. Moderate spinal stenosis. Mild bilateral foraminal stenosis.    L4-L5: Mild broad-based disc bulge. Moderate bilateral facet arthropathy.   There is asymmetric left ligamentum flavum infoldingresulting in left   lateral recess stenosis. There is no significant foraminal stenosis.    L5-S1: Mild broad-based disc bulge. No left foraminal stenosis. Moderate   right foraminal stenosis. Severe right and moderate left facet   arthropathy. No central canal stenosis.    IMPRESSION:     1. There is fluid signal within the L2-L3 disc space which may be   secondary to injury versus discitis.       There is an subacute versus chronic L3 vertebral body compression   fracture with approximately 30%height loss. There is mild marrow edema   within the anterior-inferior aspect of the L2  vertebral body which may   reflect a mild compression fracture versus Modic endplate change versus   nonspecific marrow edema.     Recommend correlation with laboratory values. If there is clinical   concern regarding discitis recommend MRI of the lumbar spine with without   and with intravenous contrast.    Dictation Site: 1        Verified By: Jennette Banker, M.D., MD   Hospital Course:  Hospital Course Admitted hypotensive with an upper gi bleed and anemia from that. He was fluid resusitated and transfused and he stabilized. He had gastritis and gastropathy, ? from portal hypertension per gi. He has not had further bleeding since being here and is being discharge in good condition. He did not tolerate nadalol re low bp.Holdinig fosamax, treat with protonix, await biopsies from gi.  02-04-13 addendum; He remained stable overnight. Technical difficulties held up dc but he is eating well and ambulating and ready for dc mid morning today   Condition on Discharge Good   DISCHARGE INSTRUCTIONS HOME MEDS:  Medication Reconciliation: Patient's Home Medications at Discharge:     Medication Instructions  tramadol 50 mg oral tablet  1 tab(s) orally every 4 hours, As Needed- for Pain    imuran 50 mg oral tablet  3.5 tab(s) orally once a day   klor-con 10 meq oral tablet, extended release  1 tab(s) orally once a day   torsemide 10 mg oral tablet  1 tab(s) orally once a day   vitamin d2 50,000 intl units oral capsule  1 cap(s) orally once a week on Sunday   pantoprazole 40 mg oral delayed release tablet  1 tab(s) orally 2 times a day     Physician's Instructions:  Diet Low Fat, Low Cholesterol   Activity Limitations As tolerated   Return to Work Not Applicable   Time frame for Follow Up Appointment 1-2 weeks   Electronic Signatures: Kirk Ruths (MD)  (Signed 12-Mar-14 06:55)  Authored: ADMISSION DATE AND DIAGNOSIS, CHIEF COMPLAINT/HPI, PERTINENT LABS, PERTINENT RADIOLOGY  STUDIES, HOSPITAL COURSE, DISCHARGE INSTRUCTIONS HOME MEDS, PATIENT INSTRUCTIONS   Last Updated: 12-Mar-14 06:55  by Kirk Ruths (MD)

## 2015-03-19 NOTE — Consult Note (Signed)
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 is a 74 year old Caucasian gentleman with past medical history of cirrhosis, likely secondary to NASH, history of chronic thrombocytopenia and chronic anemia and history of aortic valve replacement, type unknown, comes to the Emergency Room after he was found unresponsive at home. The patient had some food in his mouth that was suctioned in the Emergency Room. He was not noted to be in respiratory distress, however, he did have fever of 104.1. He received per rectal Tylenol. He was found to have a lactic acid of 4.4, tachycardic in the 90s. Blood pressure was down with systolic in the 60s to 70s. He is getting his 3rd liter of IV fluids. Blood pressure is 83/42. He is being admitted for septic shock, etiology at present unclear, possibly could be aspiration given food contents found in the mouth. The patient received IV vancomycin and Zosyn in the Emergency Room. He is being started on IV Levophed and is being admitted to the intensive care unit for further evaluation and management. No family members are present in the Emergency Room. The patient has a poor historian, unable to give me any history. He is also unable to give me any history or any details.   PFSH:  Family History noncontributory   Social History negative alcohol, negative tobacco   Additional Past Medical and Surgical History patient has a history of cirrhosis of feverand chronic thrombocytopenia Previous history of aortic wall replacement, back surgery,   Review of Systems:  Review of Systems   patient is poor historian.  Some was confused and disoriented.  Family was not present.  Most of the history obtained from talking to nurses and reviewing the chart  patient was found unresponsive and was brought been.  Patient was hypotensive.  Was started her antibiotic.  Lab being monitored.  NURSING NOTES: **Vital Signs.:   21-Jun-15 11:00   Vital Signs Type: Routine   Temperature Temperature (F): 98.1   Celsius: 36.7   Pulse Pulse: 74   Respirations Respirations: 18   Systolic BP Systolic BP: 108   Diastolic BP (mmHg) Diastolic BP (mmHg): 58   Mean BP: 74   Pulse Ox % Pulse Ox %: 100   Pulse Ox Activity Level: At rest   Oxygen Delivery: 3L; Nasal Cannula   Physical Exam:  General patient is somewhat confused disoriented lying in the bed.   Lungs: rales  crepitations   Cardiac: irregular rate, rhythm   Abdomen: soft  nontender  positive bowel sounds   Skin: rash   Extremities: edema   Physical Exam neurological cysteamine is difficult evaluated but there is no focal sign     Multi-drug Resistant Organism (MDRO): Positive culture for MRSA., 14-May-2014   heart valve issue:    carpal tunnel release bilateral: 2005   right knee replacement: 2001   disc surgery: 1967   Back: 2010   No Known Allergies:     pantoprazole 40 mg oral delayed release tablet: 1 tab(s) orally 2 times a day, Status: Active, Quantity: 0, Refills: None   lactulose 10 g/15 mL oral syrup: 60 milliliter(s) orally 2 times a day, Status: Active, Quantity: 0, Refills: None   gabapentin 300 mg oral capsule: 2 cap(s) orally 2 times a day, Status: Active, Quantity: 0, Refills: None  Ecotrin: 81 milligram(s) orally once a day, Status: Active, Quantity: 0, Refills: None   Centrum Silver Therapeutic Multiple Vitamins with Minerals oral tablet: 1 tab(s) orally once a day, Status: Active, Quantity: 0, Refills: None   torsemide 20 mg oral tablet: 1 tab(s) orally 2 times a day, Status: Active, Quantity: 0, Refills: None   Calcium 500+D: 1 tab(s) orally once a day, Status: Active, Quantity: 0, Refills: None   sucralfate 1 g oral tablet: 1 tab(s)  orally 4 times a day (before meals and at bedtime), Status: Active, Quantity: 0, Refills: None   azaTHIOprine 50 mg oral tablet: 1 tab(s) orally once a day, Status: Active, Quantity: 0, Refills: None   pravastatin 20 mg oral tablet: 1 tab(s) orally once a day (at bedtime), Status: Active, Quantity: 0, Refills: None   Klor-Con 10 mEq oral tablet, extended release: 1 tab(s) orally once a day, Status: Active, Quantity: 0, Refills: None  Laboratory Results: Routine Hem:  19-Jun-15 06:09   Hemoglobin (CBC)  10.9  Hematocrit (CBC)  31.7  Platelet Count (CBC)  51  20-Jun-15 05:32   Hemoglobin (CBC)  8.9  Hematocrit (CBC)  25.8  Platelet Count (CBC)  35  21-Jun-15 04:21   Hemoglobin (CBC)  8.2  Hematocrit (CBC)  23.9  Platelet Count (CBC)  20   Radiology Results: XRay:    20-Jun-15 09:00, Chest Portable Single View  Chest Portable Single View   REASON FOR EXAM:    sepsis  COMMENTS:       PROCEDURE: DXR - DXR PORTABLE CHEST SINGLE VIEW  - May 15 2014  9:00AM     CLINICAL DATA:  Sepsis, GI bleed    EXAM:  PORTABLE CHEST - 1 VIEW    COMPARISON:  05/14/2014    FINDINGS:  Stable central line. Heart size and vascular pattern normal. Right  lung clear. Mild interstitial change left lung base. This is mildly  increased when compared to the prior study.     IMPRESSION:  Mild developing interstitial change in the left lower lobe. This  could represent atelectasis or developing infiltrate. Radiographic  followup recommended.      Electronically Signed    By: Esperanza Heiraymond  Rubner M.D.    On: 05/15/2014 09:04         Verified By: Otilio CarpenAYMOND C. RUBNER, M.D.,  LabUnknown:  PACS Image    Assessment and Plan: Impression:   1.  Thrombocytopenia .has a history of chronic thrombocytopenia due to cirrhosis of liver and hypersplenismto get records and evaluate that.has multiple other medical illnessesevidence of acute bleedingneed for platelet transfusion unless patient is actively  bleedingplatelet tomorrow we will follow the patient and decide need for bone marrow aspiration and biopsy if needed  Electronic Signatures: Laddie Aquashoksi, Janak K (MD)  (Signed 21-Jun-15 14:33)  Authored: HISTORY OF PRESENT ILLNESS, PFSH, ROS, NURSING NOTES, PE, PAST MEDICAL HISTORY, ALLERGIES, HOME MEDICATIONS, LABS, OTHER RESULTS, ASSESSMENT AND PLAN   Last Updated: 21-Jun-15 14:33 by Laddie Aquashoksi, Janak K (MD)

## 2015-03-19 NOTE — Consult Note (Signed)
PATIENT NAME:  Victor [name], Victor [name] MR#:  [id number] DATE OF BIRTH:  Sep 05, 1941  DATE OF CONSULTATION:  05/19/2014  REFERRING PHYSICIANS:  Clydie Braun, MD / Einar Crow, MD CONSULTING PHYSICIAN:  Dwayne D. Callwood, MD  INDICATION: Possible endocarditis.   HISTORY OF PRESENT ILLNESS: The patient is a 74 year old frail white male with medical history of cirrhosis secondary to NASH, history of a chronic thrombocytopenia, chronic anemia, history of aortic valve replacement, appears to be bioprosthetic, came into the Emergency Room after being found unresponsive at home. The patient had some food in his mouth. It was suctioned in the Emergency Room. He was noted to be acidotic with elevated lactic acid level, tachycardic in the 90s, blood pressure systolic of 60s and 70s. After getting IV fluids blood pressure came up in the 80s. He was admitted for septic shock. Etiology initially was unclear, possible aspiration. The patient received IV vancomycin and Zosyn in the Emergency Room. He was placed on Levophed and placed in the intensive care unit for management. After discussion with the family and due to the patient was a poor historian and very little history could be obtained, he was admitted for aggressive evaluation and management.   PAST MEDICAL HISTORY: Cirrhosis, GI bleed, thrombocytopenia, aortic valve disease.  ALLERGIES: History could not be obtained.  MEDICATIONS: Initially unknown.   PAST SURGICAL HISTORY: Aortic valve replacement, knee replacement, back surgery.   SOCIAL HISTORY: He used to smoke. No alcohol consumption. Lives with his wife.   FAMILY HISTORY: Diabetes.   REVIEW OF SYSTEMS: Very difficult to obtain because the patient was obtunded, was a poor historian CONSTITUTIONAL: Possible fever and chills, weakness, fatigue.  EYES: No vision change.  ENT: Negative. RESPIRATORY: Complained of mild shortness of breath, dyspnea, weakness. GASTROINTESTINAL: No nausea or  vomiting. No weight loss. No weight gain. No hemoptysis or hematemesis. Denies bright red blood per rectum.  PHYSICAL EXAMINATION: VITAL SIGNS: Blood pressure initially was 80, now is up to 100. Temperature was 104, pulse 94, respiratory rate 16, on 2 liters.  HEENT: Normocephalic, atraumatic. Pupils equal and reactive to light.  NECK: Supple. No significant JVD, bruits or adenopathy.  LUNGS:   No wheezing, rhonchi, or rales.  HEART: Regular rate and rhythm. Systolic ejection murmur at the apex. PMI nondisplaced.  ABDOMEN: Benign.  EXTREMITIES: Within normal limits.  NEUROLOGIC: Difficult to assess because the patient is weak, fatigued, lethargic. SKIN: Ecchymosis.  DIAGNOSTIC DATA: CT of the head: Atrophy, otherwise negative.   Chest x-ray: Bibasilar atelectasis.   Lactic acid 4.4. Hemoglobin and hematocrit 10.9 and 317, white count 9.2, platelet count 51,000. Glucose 97, BUN 23, creatinine 1.8, sodium 135, potassium 3.4, chloride 99, bicarb 29. LFTs negative, except for CO2 53, SGPT 24. Troponin 0.04. PT 15.8. UA was negative.   ASSESSMENT:  1.  Possible septic shock, unclear etiology. 2.  Thrombocytopenia. 3.  Hepatitis. 4.  Cirrhosis of the liver. 5.  Chronic anemia. 6.  Acute on chronic renal failure.  7.  Hypomagnesemia.  8.  Coagulopathy. 9.  Possible endocarditis.   PLAN: 1.  Agree with admit. Follow up blood cultures. Continue broad-spectrum antibiotics. Agree with infectious disease consult. Will consider TEE if the patient is stable enough for it. 2.  Hypotension. Continue aggressive fluid resuscitation with antibiotic therapy, broad-spectrum. 3.  Cirrhosis of the liver. Continue current medications. Follow up laboratories.  4.  Thrombocytopenia. Appears to be chronic but stable. Would follow up but stay away from anticoagulants at this point.  5.  Acute on chronic renal failure, probably related to dehydration and sepsis. Continue fluid hydration.  6.  Hypomagnesemia.  Will try to replace magnesium IV for now.  7.  Coagulopathy. Probably related to liver disease.  8.  Continue prophylaxis for deep vein thrombosis in the interim even though he is anticoagulated. We will discuss the possibility of TEE if he stable enough to have it done. In his current medical condition, I am concerned that he is not medically stable enough at this point to have a TEE done and we should treat him empirically for endocarditis until or unless he is stable enough to undergo the procedure.  ____________________________ Bobbie Stackwayne D. Juliann Paresallwood, MD ddc:sb D: 05/20/2014 08:46:47 ET T: 05/20/2014 10:49:57 ET JOB#: [id number]  cc: Dwayne D. Juliann Paresallwood, MD, <Dictator> Alwyn PeaWAYNE D CALLWOOD MD ELECTRONICALLY SIGNED 06/21/2014 15:28

## 2015-03-19 NOTE — H&P (Signed)
PATIENT NAME:  Victor [name], MANCUSO MR#:  [id number] DATE OF BIRTH:  [birth year]  DATE OF ADMISSION:  05/14/2014  PRIMARY CARE PHYSICIAN: Dr. Einar Crow  CHIEF COMPLAINT: Found unresponsive at home.   HISTORY OF PRESENT ILLNESS: Victor [name] is a 74 year old Caucasian gentleman with past medical history of cirrhosis, likely secondary to NASH, history of chronic thrombocytopenia and chronic anemia and history of aortic valve replacement, type unknown, comes to the Emergency Room after he was found unresponsive at home. The patient had some food in his mouth that was suctioned in the Emergency Room. He was not noted to be in respiratory distress, however, he did have fever of 104.1. He received per rectal Tylenol. He was found to have a lactic acid of 4.4, tachycardic in the 90s. Blood pressure was down with systolic in the 60s to 70s. He is getting his 3rd liter of IV fluids. Blood pressure is 83/42. He is being admitted for septic shock, etiology at present unclear, possibly could be aspiration given food contents found in the mouth. The patient received IV vancomycin and Zosyn in the Emergency Room. He is being started on IV Levophed and is being admitted to the intensive care unit for further evaluation and management. No family members are present in the Emergency Room. The patient has a poor historian, unable to give me any history. He is also unable to give me any history or any details.   PAST MEDICAL HISTORY: Cirrhosis with previous history of GI bleed.  ALLERGIES: No known allergies.   MEDICATIONS: Unknown. The patient's wife to bring the medication bottles.   PAST SURGICAL HISTORY:  1.  Aortic valve replacement.  2.  Right knee replacement.  3.  Back surgery.  SOCIAL HISTORY: From old records, smoked in the past, quit about 25 years ago. Denies drinking alcohol, married, lives with his wife.   FAMILY HISTORY: Diabetes.   REVIEW OF SYSTEMS:   CONSTITUTIONAL: Positive for fever,  generalized weakness and fatigue.  EYES: No blurred or double vision, cataract or glaucoma.  ENT: No tinnitus, ear pain, hearing loss or dysphagia.  RESPIRATORY: No cough, wheeze, shortness of breath or hemoptysis.  CARDIOVASCULAR: No chest pain, shortness of breath, arrhythmia or syncope.  GASTROINTESTINAL: No nausea, vomiting, diarrhea, abdominal pain. No GERD.  GENITOURINARY: No dysuria, hematuria, or frequency.  ENDOCRINE: No polyuria, nocturia or thyroid problems.  HEMATOLOGY: Hist of chronic anemia. The patient has easy bruising secondary to probably cirrhosis and low platelet count. No active bleeding at this time.  SKIN: No acne, rash, lesion. The patient has lot of bruises in both upper and lower extremities.  MUSCULOSKELETAL: Positive for arthritis. No swelling or gout.  NEUROLOGIC: No CVA, TIA, dysarthria or tremors. The patient has some confusion.  PSYCHIATRIC: No anxiety, depression or bipolar disorder. All other systems reviewed and negative.   PHYSICAL EXAMINATION: GENERAL: The patient is awake. He is alert. He is oriented x1.  VITAL SIGNS: Temperature is 104.1. Pulse is 94. Blood pressure is 79/40. Sats are 98% on 2 liters.  HEENT: Atraumatic, normocephalic. Pupils are equal, round and reactive to light and accommodation. EOM intact. Oral mucosa is dry. The patient had some food contents in his mouth, which was suctioned. Poor dentition.  NECK: Supple. No JVD. No carotid bruit.  LUNGS: Clear to auscultation bilaterally. No rales, rhonchi, respiratory distress or labored breathing.  HEART: Both the heart sounds are normal. Tachycardia present. No murmur heard. PMI not lateralized. Chest nontender.  EXTREMITIES: Good femoral pulses. Feeble  pedal pulses. The patient has significant bruises in both upper and lower extremity. There is 2+ pitting edema in lower extremity.  NEUROLOGIC: Unable to assess; however, the patient has no focal neuro deficit. Able to move all extremities well.  He has generalized weakness.  SKIN: Warm and dry.  PSYCHIATRIC: The patient is awake. He is alert, however not oriented to time, place, and person.  DIAGNOSTIC DATA: CT of the head without contrast shows atrophy with patchy periventricular small vessel disease.   Chest x-ray: Minimal bibasilar atelectasis.  Lactic acid is 4.4. H and H 10.9 and 31.7, white count 9.2, and platelet count 51,000. Glucose is 97, BUN 23, creatinine 1.8, sodium 135, potassium 3.4, chloride 99, bicarb 27, calcium 8.2, bilirubin total 1.4, SGPT 24, and SGOT 53. Magnesium is 1.4. Troponin is 0.04. Phosphorus is 3.7. PT-INR is 15.8 and 1.3. UA is negative for UTI.  ASSESSMENT AND PLAN: A 74 year old, Victor [name], with past medical history of aortic valve replacement, history of cirrhosis of liver with portal hypertension and acquired coagulopathy who comes in with fever, altered mental status, and tachycardia. He was found to have significant amount of food in his mouth, which was suctioned and is being admitted with:  1.  Septic shock. Source of sepsis unclear. Possibility aspiration pneumonitis versus urinary tract infection, although urinalysis looks relatively clear and clean. Chest x-ray does not show any evidence of pneumonia; however, significant amount of fluid was suctioned in the Emergency Room. The patient currently is not in respiratory distress. Sats are 100% on 2 liters nasal cannula oxygen. We will continue wide-open IV fluids. Levophed drip has been ordered. Will give pressors and if needed Levophed has been ordered. The patient's blood pressure is improved after 2 liters of IV fluid. His systolic is 80/42. We will give him empirically IV vancomycin and Zosyn, pharmacy to dose. Follow blood cultures and urine cultures. White count is stable at this time. P.r.n. rectal acetaminophen and Tylenol suppository for fever. 2.  History of cirrhosis of liver. Will continue to monitor his labs.  3.  Chronic thrombocytopenia,  suspected due to cirrhosis of liver.  4.  Chronic anemia. Hemoglobin and hematocrit is stable. No active bleeding.  5.  Acute on chronic renal failure, likely prerenal azotemia. The patient has been receiving wide open fluids. Will give maintenance fluid at 150 mL/hour. Avoid nephrotoxins. Monitor ins and outs.  6.  Hypomagnesemia. Replete with electrolyte protocol. Will give IV magnesium stat.  7.  Mild acquired coagulopathy in the setting of cirrhosis. Watch for any active bleeding.  8.  Deep vein thrombosis prophylaxis with SCDs and TEDs. Given low platelet count I am hesitant to give any antiplatelet agents at this time.   Further work-up according to the patient's clinical course. Hospital admission plan was discussed with the patient's wife, Joylene DraftMargaret Longie, over the phone. Per wife, the patient is a NO CODE, DNR. We will place that order in. The patient's care will be resumed by Dr. Dareen PianoAnderson.   CRITICAL TIME SPENT: 55 minutes.    ____________________________ Wylie HailSona A. Allena KatzPatel, MD sap:sb D: 05/14/2014 07:47:13 ET T: 05/14/2014 08:11:00 ET JOB#: [id number]  cc: Sona A. Allena KatzPatel, MD, <Dictator> Marya AmslerMarshall W. Dareen PianoAnderson, MD Willow OraSONA A PATEL MD ELECTRONICALLY SIGNED 05/29/2014 10:25

## 2015-03-19 NOTE — Discharge Summary (Signed)
PATIENT NAME:  Victor [name], Victor [name] MR#:  [id number] DATE OF BIRTH:  May 24, 1941  DATE OF ADMISSION:  05/14/2014 DATE OF DISCHARGE:  05/20/2014  DISCHARGE DIAGNOSES: 1. 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  2. Encephalopathy, hepatic. 3. 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  4. Diarrhea from lactulose use.  5. Diarrhea from Clostridium difficile infection.  6. Hypercholesterolemia.   DISCHARGE MEDICATIONS: [facility]. We will  give him four weeks of vancomycin and have him follow up with Dr. Clydie Braunavid Fitzgerald from infectious disease and judge any further needs at that point. Also stay on Flagyl until a week after his vancomycin. Hold his azathioprine for now, though he needs to have an appointment scheduled [facility] soon to decide when or if this should be restarted.   HISTORY AND PHYSICAL: Please see detailed history and physical on admission.   HOSPITAL COURSE: The patient was admitted. MRSA found in his blood. He is febrile. Transthoracic echocardiogram was without signs of vegetations on his valves. No other obvious embolic source or seeding from the MRSA was noted. Transesophageal echocardiogram could not be done secondary to his thrombocytopenia, which luckily his platelets were up to 50,000 by today's date. Infectious disease was consulted. Agree with vancomycin. We will have them follow up with them soon as well. Magnesium was low, which he it can be. It was replaced IV multiple times, and should be followed to ensure it stays up along with his potassium. He will get an extra oral dose of potassium today prior to discharge. His echocardiogram again showed normal LV function without obvious vegetations. He did have some mitral regurgitation. We will discharge to skilled facility for vancomycin therapy as possible. Watch his mental status closely as well.   NOTE: It took approximately  36 minutes to do all discharge tasks today.  ____________________________ Marya AmslerMarshall [name]. Dareen PianoAnderson, MD mwa:sg D: 05/20/2014 08:14:27 ET T: 05/20/2014 08:30:23 ET JOB#: [id number]  cc: Marya AmslerMarshall [name]. Dareen PianoAnderson, MD, <Dictator> Lauro RegulusMARSHALL [name] ANDERSON MD ELECTRONICALLY SIGNED 05/24/2014 7:46

## 2015-03-19 NOTE — Consult Note (Signed)
PATIENT NAME:  Victor [name], Victor W MR#:  [id number] DATE OF BIRTH:  14-Apr-1941  DATE OF CONSULTATION:  05/17/2014  CONSULTING PHYSICIAN:  Stann Mainlandavid P. Sampson GoonFitzgerald, MD  REFERRING PHYSICIAN: Dr. Einar CrowMarshall Anderson.   REASON FOR CONSULTATION: MRSA bacteremia.   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 There was some concern he had aspirated. He had a fever of 104. He also had a lactic acid of 4.4 and was septic and hypotensive. The patient was admitted to the intensive care unit where he received IV resuscitation and pressors and has clinically improved some. However, his cultures from his blood are growing MRSA.   The patient is clinically somewhat more awake today, although he is still quite confused per my interview with him and per nursing. He does have multiple cuts and scrapes on him, and apparently he had told nursing that his dog had been scratching at them. On further discussion with him, he does report he has pain and is able to verbalize he has pain, especially in his lumbar back. He does report this is somewhat chronic, however. Little else history is able to be obtained.   PAST MEDICAL HISTORY:  1. Cirrhosis with previous history of GI bleeding.  2. Aortic stenosis status post aortic valve replacement. [facility] had a #25 Medtronic Mosaic holder and aortic valve replacement 06/08/2011.  3. Hypertension.  4. Hyperlipidemia.  5. Coronary artery disease.  6. Sleep apnea.  7. Deep vein thrombosis.   SOCIAL HISTORY: Unable to obtain, but apparently he did not drink or smoke.   FAMILY HISTORY: Noncontributory.   PAST SURGICAL HISTORY: Right knee replacement, lumbar back surgery, carpal tunnel surgery, valve replacement as above.   ALLERGIES: THE PATIENT IS  APPARENTLY ALLERGIC TO ZOLOFT, WHICH CAUSES DIARRHEA.   ANTIBIOTICS SINCE ADMISSION: Include Zosyn begun June 18th through the 20th, vancomycin June 18th through the 21st.  REVIEW OF SYSTEMS: Unable to be obtained.   PHYSICAL EXAMINATION:  VITAL SIGNS: Temperature 97.8, pulse 80, blood pressure 130/70, respirations 18, sat 96% on room air. Since admission, the patient has had no further fevers.  GENERAL: He is quite thin and disheveled. He is confused.   HEENT: Pupils equal, round and reactive to light and accommodation. Extraocular movements are intact. Sclerae anicteric.  OROPHARYNX: Clear but mucous membranes are dry.  NECK: Supple.  HEART: Regular but distant heart sounds.  LUNGS: Coarse breath sounds bilaterally.  ABDOMEN: Soft, nontender, nondistended.  EXTREMITIES: He has 2+ edema, bilateral lower extremities.  SKIN: He has multiple abrasions and lacerations on his legs and arms.  NEUROLOGIC: He is confused, but able to move all 4 extremities.  MUSCULOSKELETAL: No obvious joint swelling or tenderness.   LABORATORY DATA: White blood count on admission was 9.2, currently it is 5.3, hemoglobin 8.8, platelets of 24,000. INR on admission was 1.3. LFTs on admission were normal except albumin down at 2.3. INR is currently 0.69, potassium 1.8, on admission. Blood cultures 2 of 2 June 19th shows MRSA sensitive to gentamicin, vancomycin, linezolid, and tigecycline. Urine culture June 19th was negative. Echocardiogram done June 21 shows EF 55%-60%, moderate thickening and calcification of the anterior and posterior mitral valve leaflets. There is moderate-to-severe mitral valve regurg and mild aortic sclerosis without stenosis. There is mild-to-moderate tricuspid regurg.   IMAGING STUDIES:  Abdominal ultrasound June 22nd showed changes suggestive of cirrhosis with mild ascites and a small right-sided pleural effusion. Chest x-ray: Mild developing interstitial change in the left lower lobe on June  20th.    IMPRESSION: A 74 year old complicated gentleman with cirrhosis as well as aortic stenosis status post aortic valve replacement in 2012 here after being found unresponsive and having a fever to 104. He has multiple excoriations and lacerations on his skin and he has methicillin-resistant Staphylococcus aureus bacteremia with the likely source being the skin. He is at very high risk for endocarditis given the bioprosthetic valve. TTE did not reveal any vegetations, however. He has defervesced and clinically improved with treatment.   RECOMMENDATIONS:  1. Repeat blood cultures today.  2. We will consult cardiology and consider TEE to evaluate for endocarditis. The little history he gives today, he does report back pain. He has seen Dr. Yves Dill for this in the past so this is not an acute problem. However, as he becomes more alert, if he continues to complain of it, I would suggest an MRI to evaluate for osteomyelitis as Staphylococcus aureus can certainly cause hematogenous spread and osteomyelitis.  3. Given how ill he was with the methicillin-resistant Staphylococcus aureus bacteremia, I would suggest at least a 2-week course of IV antibiotics. This can be vancomycin. Goal trough should be 15 to 20. We will consider further prolonging the antibiotics based on TEE if it is able to be done.  4. Thank you for the consult. I will be glad to follow with you.     ____________________________ Stann Mainland. Sampson Goon, MD dpf:lt D: 05/17/2014 22:35:16 ET T: 05/18/2014 00:37:48 ET JOB#: [id number]  cc: Stann Mainland. Sampson Goon, MD, <Dictator> DAVID Sampson Goon MD ELECTRONICALLY SIGNED 05/20/2014 13:31

## 2015-07-20 IMAGING — CT CT HEAD WITHOUT CONTRAST
3 series · 19 of 30 positions shown, 20 images · non-contrast
Comparison: None.

CLINICAL DATA: Altered mental status

EXAM:
CT HEAD WITHOUT CONTRAST
TECHNIQUE: Contiguous axial images were obtained from the base of the skull
through the vertex without intravenous contrast.

[Series 2: head bone · axial · 0.45mm/px · z∈[-268,-112]mm · 8 of 94 slices shown]
[im 8/94  bone]
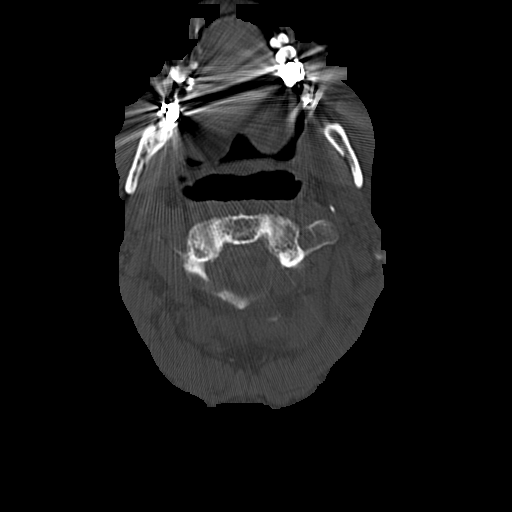
[im 22/94  bone]
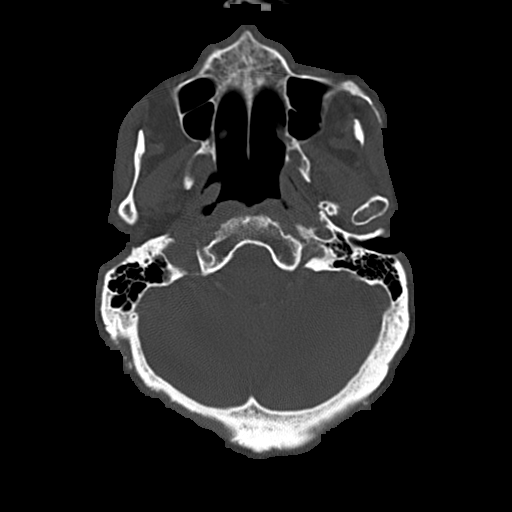
[im 29/94  bone]
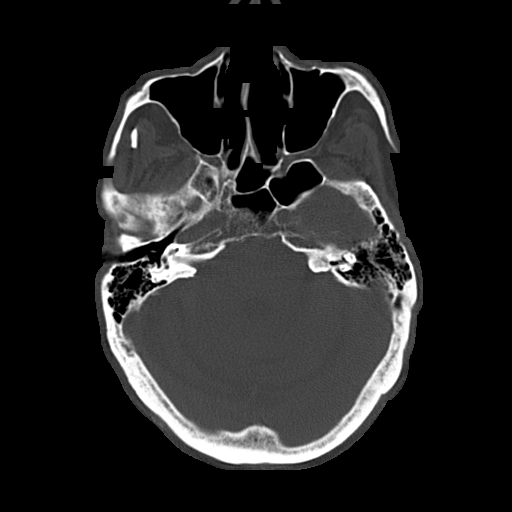
[im 43/94  bone]
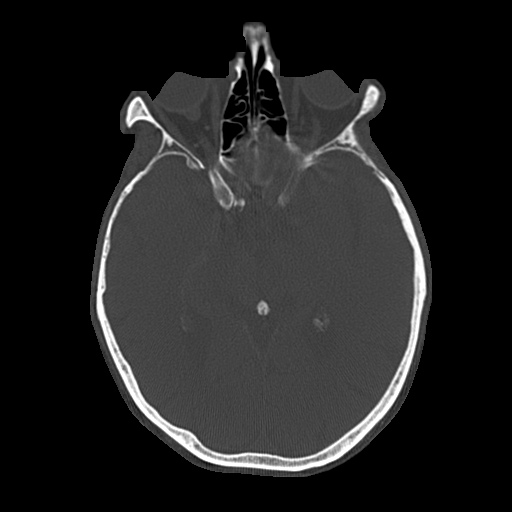
[im 51/94  bone]
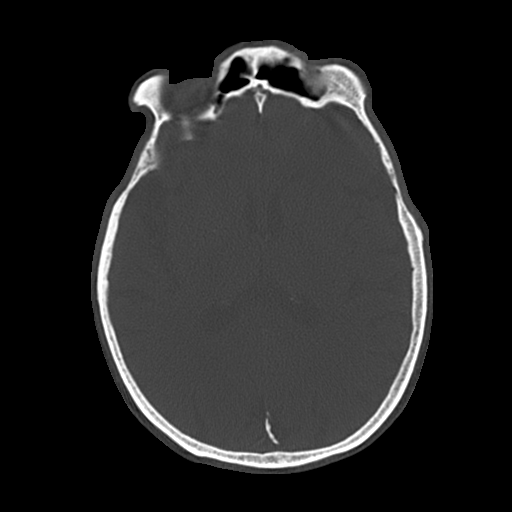
[im 65/94  bone]
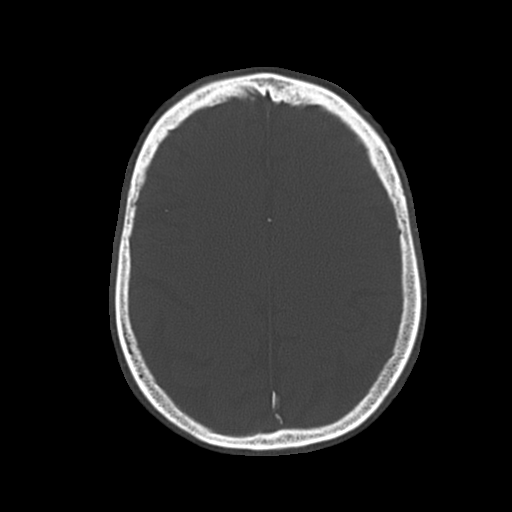
[im 72/94  bone]
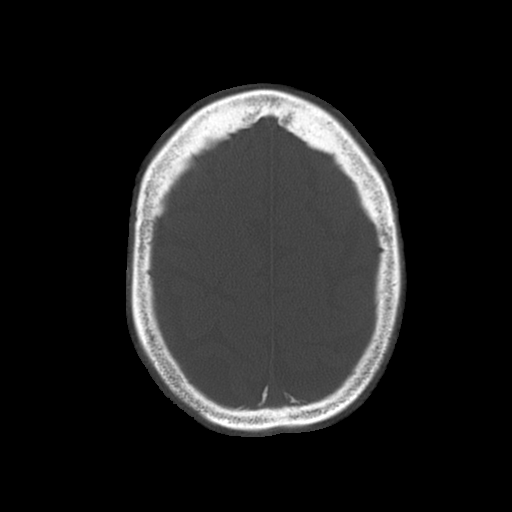
[im 86/94  bone]
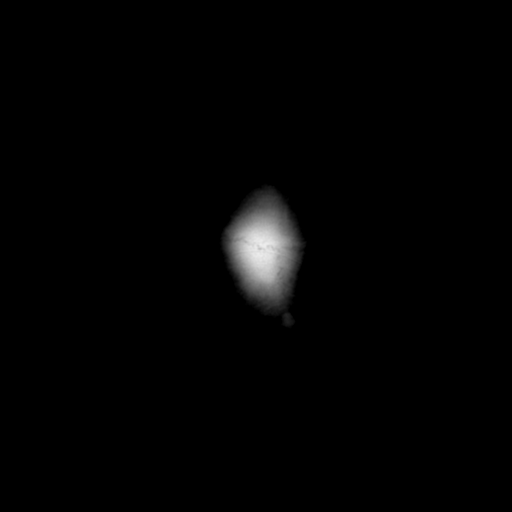

[Series 3: head wo · axial · 0.45mm/px · z∈[-233,-153]mm · 3 of 34 slices shown, 4 images]
[im 9/34  brain]
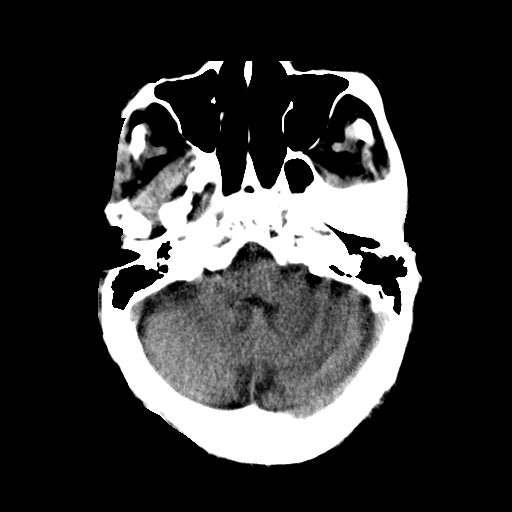
[im 9/34  bone]
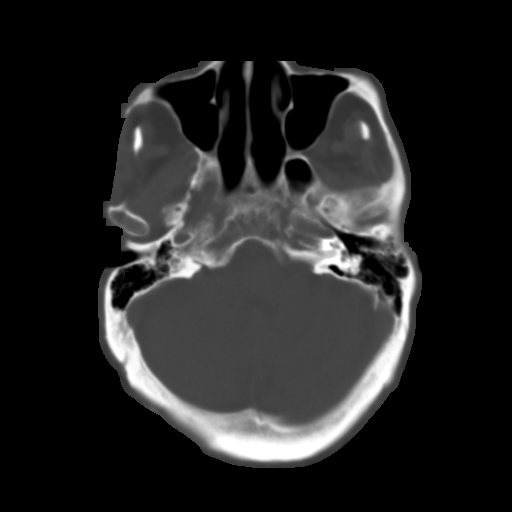
[im 17/34  brain]
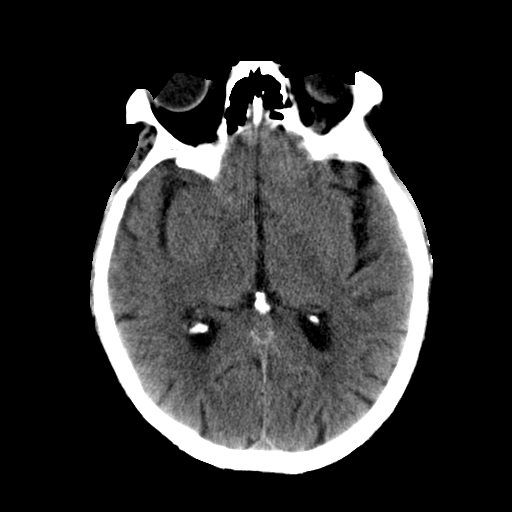
[im 25/34  brain]
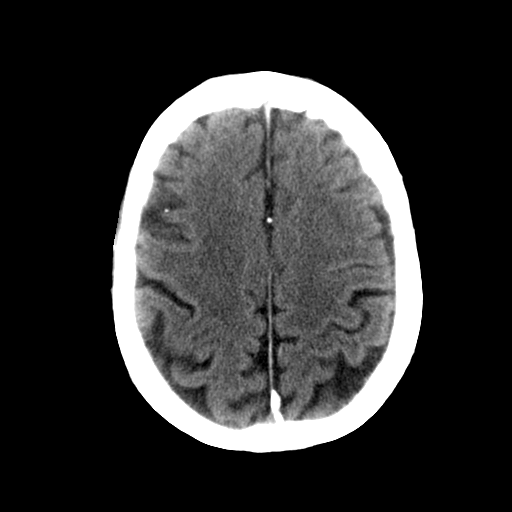

[Series 5: head bone recons · axial · 0.45mm/px · z∈[-153,-60]mm · 8 of 72 slices shown]
[im 8/72  bone]
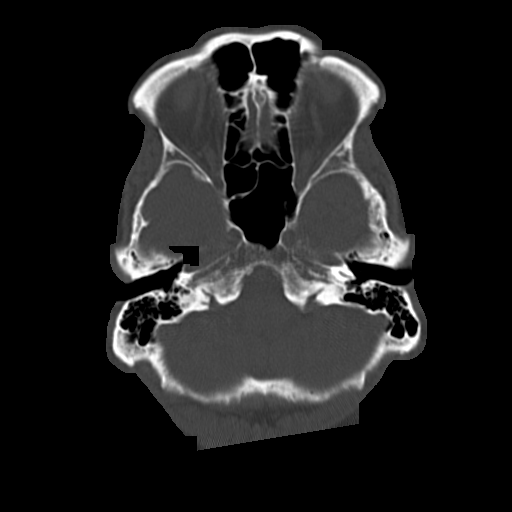
[im 15/72  bone]
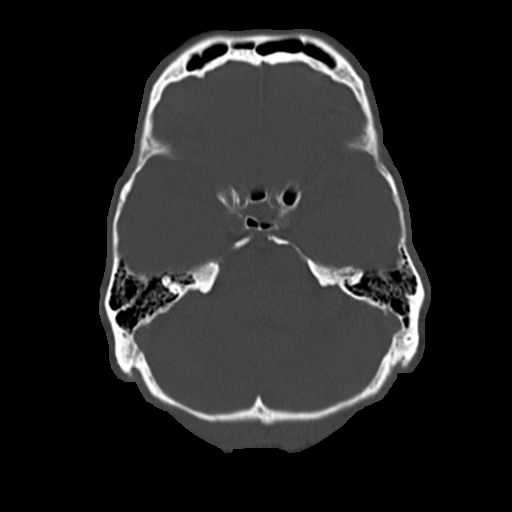
[im 22/72  bone]
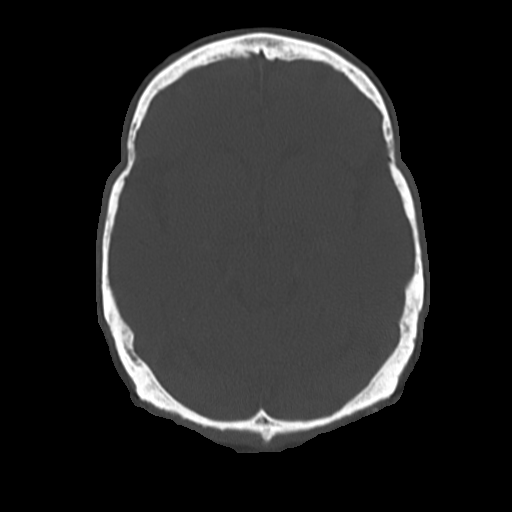
[im 29/72  bone]
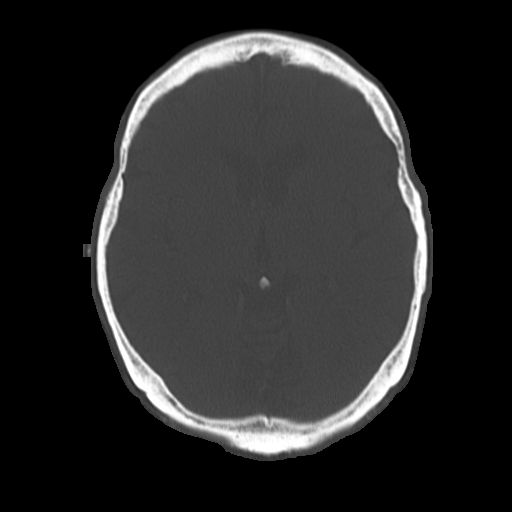
[im 43/72  bone]
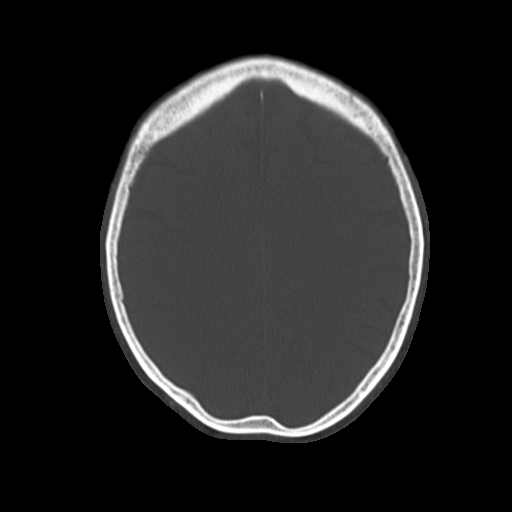
[im 50/72  bone]
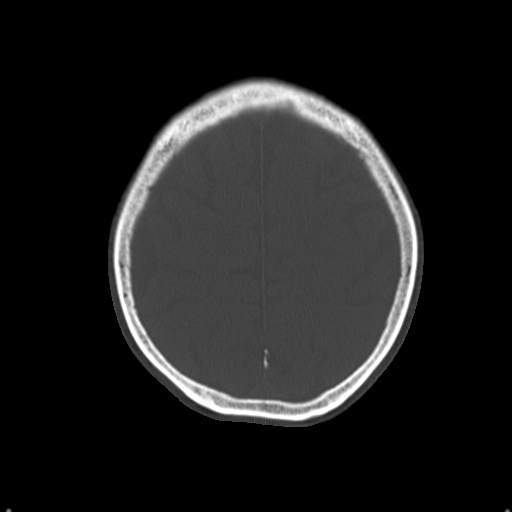
[im 57/72  bone]
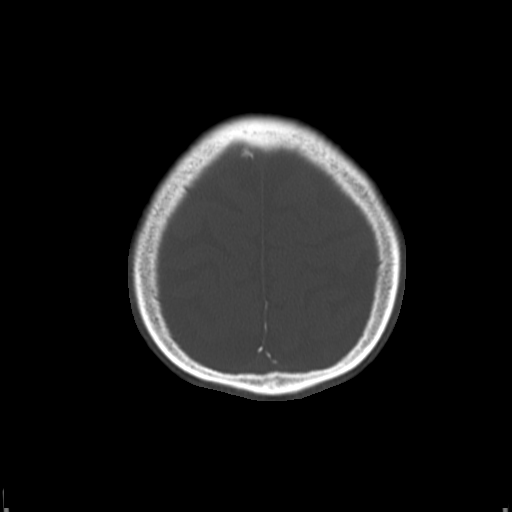
[im 64/72  bone]
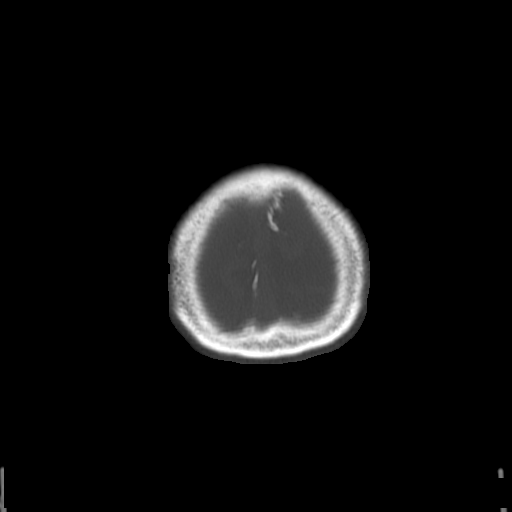

[19 of 30 positions shown; findings below may reference images not displayed]

FINDINGS: There is mild diffuse atrophy. There is no mass, hemorrhage,
extra-axial fluid collection, or midline shift. There is patchy
small vessel disease in the centra semiovale bilaterally. There is
no apparent acute infarct. The bony calvarium appears intact. The
mastoid air cells are clear.
IMPRESSION: Atrophy with patchy periventricular small vessel disease. No
intracranial mass, hemorrhage, or acute appearing infarct.

## 2015-07-20 IMAGING — CR DG CHEST 1V PORT
1 series · 1 of 1 positions shown · non-contrast
Comparison: Study obtained earlier in the day

CLINICAL DATA: Central catheter placement

EXAM:
PORTABLE CHEST - 1 VIEW

[ap]
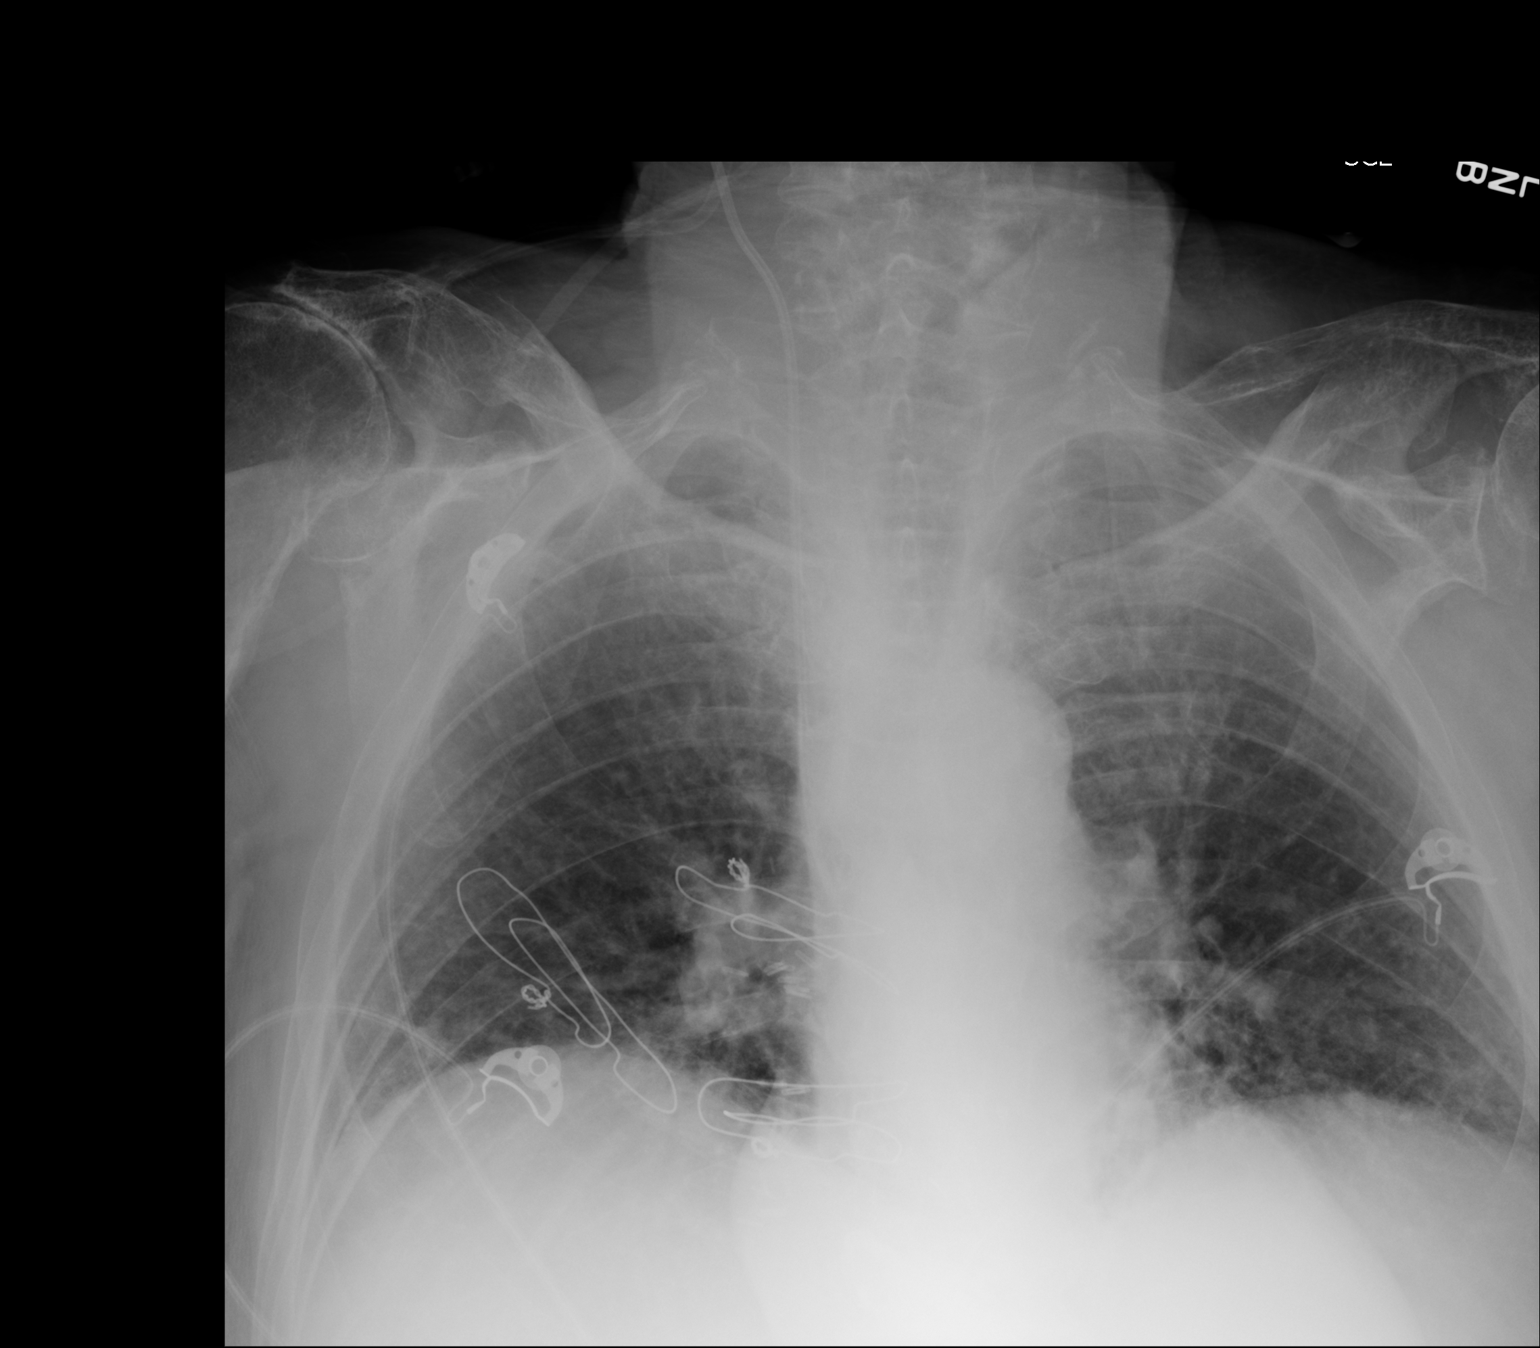

[1 of 1 positions shown; findings below may reference images not displayed]

FINDINGS: Central catheter tip is in the right atrium near the cavoatrial
junction. There is no demonstrable pneumothorax. There is slight
generalized interstitial edema. Lungs are otherwise clear. Heart
size and pulmonary vascularity are normal. There is arthropathy in
both shoulders with superior migration of both humeral heads
consistent with chronic rotator cuff tears. Postoperative changes
are noted in the mediastinum and right chest.
IMPRESSION: Central catheter tip in right atrium near cavoatrial junction. No
pneumothorax. Slight generalized interstitial edema without airspace
consolidation. Chronic rotator cuff tears bilaterally.
